# Patient Record
Sex: Female | Born: 1995 | Race: Black or African American | Hispanic: No | Marital: Married | Smoking: Never smoker
Health system: Southern US, Community
[De-identification: ages and names within clinical notes are randomized; demographics above are authoritative.]

## PROBLEM LIST (undated history)

## (undated) DIAGNOSIS — K5090 Crohn's disease, unspecified, without complications: Secondary | ICD-10-CM

## (undated) DIAGNOSIS — J45909 Unspecified asthma, uncomplicated: Secondary | ICD-10-CM

## (undated) HISTORY — PX: TONSILLECTOMY: SUR1361

## (undated) NOTE — *Deleted (*Deleted)
[place]   PATIENT NAME: Megan Haynes    MR#:  [id number]  DATE OF BIRTH:  1995-09-04  DATE OF ADMISSION:  02/09/2020  PRIMARY CARE PHYSICIAN: Patient, No Pcp Per   REQUESTING/REFERRING PHYSICIAN: Alfonse Flavors, MD  CHIEF COMPLAINT:   Chief Complaint  Patient presents with  . Abdominal Pain    HISTORY OF PRESENT ILLNESS:  Megan Haynes  is a 24 y.o. African-American female with a known history of asthma, who presented to the emergency room with acute onset of periumbilical abdominal pain for the last 5 to 6 days which has been worsening.  She describes 5.  Pain is crampy and constant with radiation to her back.  It was associated with nausea and vomiting as well as diarrhea for the last couple of days.  She denied any bilious vomitus or hematemesis or bright red bleeding per rectum or melena.  She had a similar episode last year that was evaluated by GI physician apparently diagnosed possible inflammatory bowel disease.  She has been taking simethicone.  No fever or chills.  No cough or wheezing or dyspnea.  Upon presentation to the emergency room blood pressure was 126/98 and heart rate 118.  Labs revealing cytosis of 14.6.  BMP is currently pending.  UA showed 6-10 WBCs 6-10 RBCs with rare bacteria S percent range 1031 with 80 ketones.  Urine pregnancy test was negative.  Blood culture was drawn.  Abdominal and pelvic CT scan revealed: 1. Marked severity enteritis involving multiple loops of distal and terminal ileum. 2. Additional findings worrisome for the presence of a focus of contained free air within the pelvis, secondary to perforation of the previously noted inflamed small bowel. 3. Findings likely consistent with a small hepatic cyst versus hemangioma.  The patient was given a liter bolus of IV lactated Ringer for 150 mL/h, 0.5 mg of IV Dilaudid and IV Zosyn.  She will be admitted to a medical monitored bed for further evaluation and management.  PAST MEDICAL  HISTORY:   Past Medical History:  Diagnosis Date  . Asthma     PAST SURGICAL HISTORY:  History reviewed. No pertinent surgical history.  SOCIAL HISTORY:   Social History   Tobacco Use  . Smoking status: Never Smoker  . Smokeless tobacco: Never Used  Substance Use Topics  . Alcohol use: Not Currently    FAMILY HISTORY:  No family history on file.  DRUG ALLERGIES:  No Known Allergies  REVIEW OF SYSTEMS:   ROS As per history of present illness. All pertinent systems were reviewed above. Constitutional, HEENT, cardiovascular, respiratory, GI, GU, musculoskeletal, neuro, psychiatric, endocrine, integumentary and hematologic systems were reviewed and are otherwise negative/unremarkable except for positive findings mentioned above in the HPI.   MEDICATIONS AT HOME:   Prior to Admission medications   Medication Sig Start Date End Date Taking? Authorizing Provider  HYDROcodone-acetaminophen (NORCO/VICODIN) 5-325 MG tablet Take 1-2 tablets by mouth every 4 (four) hours as needed for moderate pain. 11/05/15   Loleta Rose, MD      VITAL SIGNS:  Blood pressure (!) 126/98, pulse (!) 118, temperature 98.7 F (37.1 C), temperature source Oral, resp. rate 16, height 5\' 5"  (1.651 m), weight (!) 147.4 kg, last menstrual period 01/03/2020, SpO2 98 %.  PHYSICAL EXAMINATION:  Physical Exam  GENERAL:  89 y.o.-year-old patient lying in the bed with no acute distress.  EYES: Pupils equal, round, reactive to light and accommodation. No scleral icterus. Extraocular muscles intact.  HEENT:  Head atraumatic, normocephalic. Oropharynx and nasopharynx clear.  NECK:  Supple, no jugular venous distention. No thyroid enlargement, no tenderness.  LUNGS: Normal breath sounds bilaterally, no wheezing, rales,rhonchi or crepitation. No use of accessory muscles of respiration.  CARDIOVASCULAR: Regular rate and rhythm, S1, S2 normal. No murmurs, rubs, or gallops.  ABDOMEN: Soft, nondistended, nontender.  Bowel sounds present. No organomegaly or mass.  EXTREMITIES: No pedal edema, cyanosis, or clubbing.  NEUROLOGIC: Cranial nerves II through XII are intact. Muscle strength 5/5 in all extremities. Sensation intact. Gait not checked.  PSYCHIATRIC: The patient is alert and oriented x 3.  Normal affect and good eye contact. SKIN: No obvious rash, lesion, or ulcer.   LABORATORY PANEL:   CBC Recent Labs  Lab 02/09/20 1810  WBC 14.6*  HGB 10.5*  HCT 32.9*  PLT 340   ------------------------------------------------------------------------------------------------------------------  Chemistries  No results for input(s): NA, K, CL, CO2, GLUCOSE, BUN, CREATININE, CALCIUM, MG, AST, ALT, ALKPHOS, BILITOT in the last 168 hours.  Invalid input(s): GFRCGP ------------------------------------------------------------------------------------------------------------------  Cardiac Enzymes No results for input(s): TROPONINI in the last 168 hours. ------------------------------------------------------------------------------------------------------------------  RADIOLOGY:  CT ABDOMEN PELVIS W CONTRAST  Result Date: 02/09/2020 CLINICAL DATA:  Mid upper abdominal pain. EXAM: CT ABDOMEN AND PELVIS WITH CONTRAST TECHNIQUE: Multidetector CT imaging of the abdomen and pelvis was performed using the standard protocol following bolus administration of intravenous contrast. CONTRAST:  OMNIPAQUE IOHEXOL 300 MG/ML  SOLN COMPARISON:  None. FINDINGS: Lower chest: No acute abnormality. Hepatobiliary: A 1.0 cm diameter focus of parenchymal low attenuation is seen within the left lobe of the liver (axial CT image 11, CT series number 2). No gallstones, gallbladder wall thickening, or biliary dilatation. Pancreas: Unremarkable. No pancreatic ductal dilatation or surrounding inflammatory changes. Spleen: Normal in size without focal abnormality. Adrenals/Urinary Tract: Adrenal glands are unremarkable. Kidneys are  normal, without renal calculi, focal lesion, or hydronephrosis. The urinary bladder is contracted and subsequently limited in evaluation. Stomach/Bowel: Stomach is within normal limits. The appendix is retrocecal in location and normal in appearance (axial CT images 57 through 64, CT series number 2/sagittal reformatted images 44 through 59, CT series number 6). There is no evidence of bowel dilatation. Mild to moderate severity thickening of the ascending and proximal transverse colon is seen. Multiple loops of markedly thickened and inflamed distal and terminal ileum are seen within the right lower quadrant and pelvis. Associated peri intestinal inflammatory fat stranding is present. A 1.9 cm x 1.1 cm x 3.1 cm focus of air is seen within the pelvis on the right. This is adjacent to the previously described loops of inflamed distal ileum and cannot definitively be localized within a loop of bowel (axial CT images 68 through 74, CT series number 2). Vascular/Lymphatic: No significant vascular findings are present. No enlarged abdominal or pelvic lymph nodes. Reproductive: Uterus and bilateral adnexa are unremarkable. Other: No abdominal wall hernia or abnormality. No abdominopelvic ascites. Musculoskeletal: No acute or significant osseous findings. IMPRESSION: 1. Marked severity enteritis involving multiple loops of distal and terminal ileum. 2. Additional findings worrisome for the presence of a focus of contained free air within the pelvis, secondary to perforation of the previously noted inflamed small bowel. 3. Findings likely consistent with a small hepatic cyst versus hemangioma. Electronically Signed   By: Aram Candela M.D.   On: 02/09/2020 20:20      IMPRESSION AND PLAN:   1.  Acute severe enteritis with possible small bowel microperforation with subsequent sepsis without severe sepsis or septic  shock.  This is manifested by leukocytosis and tachycardia. -The patient will be admitted to a med  monitored bed. -Pain management will be provided. -Surgery consultation will be obtained. -I notified Dr. Everlene Farrier about the patient. -Continue IV antibiotic therapy with broad-spectrum coverage including IV cefepime, Flagyl and vancomycin given severity of her enteritis pending blood and stool cultures.  2.  Asthma, currently controlled. -She will be placed on as needed duo nebs.  3.  GERD. -She will be placed on IV PPI therapy.  4.  DVT prophylaxis. -Subcutaneous Lovenox    All the records are reviewed and case discussed with ED provider. The plan of care was discussed in details with the patient (and family). I answered all questions. The patient agreed to proceed with the above mentioned plan. Further management will depend upon hospital course.   CODE STATUS: Full code  Status is: Inpatient  Remains inpatient appropriate because:Ongoing active pain requiring inpatient pain management, Ongoing diagnostic testing needed not appropriate for outpatient work up, Unsafe d/c plan, IV treatments appropriate due to intensity of illness or inability to take PO and Inpatient level of care appropriate due to severity of illness   Dispo: The patient is from: Home              Anticipated d/c is to: Home              Anticipated d/c date is: 2 days              Patient currently is not medically stable to d/c.   TOTAL TIME TAKING CARE OF THIS PATIENT: *** minutes.    Hannah Beat M.D on 02/09/2020 at 9:51 PM  [facility]   From 7 PM-7 AM, contact night-coverage [email or web address]  CC: Primary care physician; Patient, No Pcp Per

## (undated) NOTE — *Deleted (*Deleted)
[name]   PATIENT NAME: Megan Haynes    MR#:  [id number]  DATE OF BIRTH:  20-Jun-1995  DATE OF ADMISSION:  02/09/2020  PRIMARY CARE PHYSICIAN: Patient, No Pcp Per   REQUESTING/REFERRING PHYSICIAN: Alfonse Flavors, MD  CHIEF COMPLAINT:   Chief Complaint  Patient presents with  . Abdominal Pain    HISTORY OF PRESENT ILLNESS:  Megan Haynes  is a 64 y.o. African-American female with a known history of asthma, who presented to the emergency room with acute onset of periumbilical and upper abdominal pain for the last 5 to 6 days which has been worsening.  She describes her pain as crampy and constant with radiation to her back.  It was associated with nausea and vomiting on Monday as well as diarrhea for the last couple of days.  She denied any bilious vomitus or hematemesis or bright red bleeding per rectum or melena.  She had a similar episode last year that was evaluated by Dr. Margarita Mail with GI at [facility] who apparently diagnosed possible inflammatory bowel disease.  She has been taking simethicone.  No fever or chills.  No cough or wheezing or dyspnea.  Upon presentation to the emergency room blood pressure was 126/98 and heart rate 118.  Labs revealing cytosis of 14.6.  BMP is currently pending.  UA showed 6-10 WBCs 6-10 RBCs with rare bacteria S percent range 1031 with 80 ketones.  Urine pregnancy test was negative.  Blood culture was drawn.  Abdominal and pelvic CT scan revealed: 1. Marked severity enteritis involving multiple loops of distal and terminal ileum. 2. Additional findings worrisome for the presence of a focus of contained free air within the pelvis, secondary to perforation of the previously noted inflamed small bowel. 3. Findings likely consistent with a small hepatic cyst versus hemangioma.  The patient was given a liter bolus of IV lactated Ringer for 150 mL/h, 0.5 mg of IV Dilaudid and IV Zosyn.  She will be admitted to a medical monitored bed for  further evaluation and management.  PAST MEDICAL HISTORY:   Past Medical History:  Diagnosis Date  . Asthma     PAST SURGICAL HISTORY:  History reviewed. No pertinent surgical history.  SOCIAL HISTORY:   Social History   Tobacco Use  . Smoking status: Never Smoker  . Smokeless tobacco: Never Used  Substance Use Topics  . Alcohol use: Not Currently    FAMILY HISTORY:  No family history on file.  DRUG ALLERGIES:  No Known Allergies  REVIEW OF SYSTEMS:   ROS As per history of present illness. All pertinent systems were reviewed above. Constitutional, HEENT, cardiovascular, respiratory, GI, GU, musculoskeletal, neuro, psychiatric, endocrine, integumentary and hematologic systems were reviewed and are otherwise negative/unremarkable except for positive findings mentioned above in the HPI.   MEDICATIONS AT HOME:   Prior to Admission medications   Medication Sig Start Date End Date Taking? Authorizing Provider  HYDROcodone-acetaminophen (NORCO/VICODIN) 5-325 MG tablet Take 1-2 tablets by mouth every 4 (four) hours as needed for moderate pain. 11/05/15   Loleta Rose, MD      VITAL SIGNS:  Blood pressure (!) 126/98, pulse (!) 118, temperature 98.7 F (37.1 C), temperature source Oral, resp. rate 16, height 5\' 5"  (1.651 m), weight (!) 147.4 kg, last menstrual period 01/03/2020, SpO2 98 %.  PHYSICAL EXAMINATION:  Physical Exam  GENERAL:  40 y.o.-year-old patient lying in the bed with no acute distress.  EYES: Pupils equal, round, reactive to light and  accommodation. No scleral icterus. Extraocular muscles intact.  HEENT: Head atraumatic, normocephalic. Oropharynx and nasopharynx clear.  NECK:  Supple, no jugular venous distention. No thyroid enlargement, no tenderness.  LUNGS: Normal breath sounds bilaterally, no wheezing, rales,rhonchi or crepitation. No use of accessory muscles of respiration.  CARDIOVASCULAR: Regular rate and rhythm, S1, S2 normal. No murmurs, rubs, or  gallops.  ABDOMEN: Soft, nondistended, nontender. Bowel sounds present. No organomegaly or mass.  EXTREMITIES: No pedal edema, cyanosis, or clubbing.  NEUROLOGIC: Cranial nerves II through XII are intact. Muscle strength 5/5 in all extremities. Sensation intact. Gait not checked.  PSYCHIATRIC: The patient is alert and oriented x 3.  Normal affect and good eye contact. SKIN: No obvious rash, lesion, or ulcer.   LABORATORY PANEL:   CBC Recent Labs  Lab 02/09/20 1810  WBC 14.6*  HGB 10.5*  HCT 32.9*  PLT 340   ------------------------------------------------------------------------------------------------------------------  Chemistries  Recent Labs  Lab 02/09/20 2119  NA 135  K 3.1*  CL 98  CO2 26  GLUCOSE 85  BUN 10  CREATININE 0.72  CALCIUM 8.8*  AST 12*  ALT 11  ALKPHOS 50  BILITOT 0.7   ------------------------------------------------------------------------------------------------------------------  Cardiac Enzymes No results for input(s): TROPONINI in the last 168 hours. ------------------------------------------------------------------------------------------------------------------  RADIOLOGY:  CT ABDOMEN PELVIS W CONTRAST  Result Date: 02/09/2020 CLINICAL DATA:  Mid upper abdominal pain. EXAM: CT ABDOMEN AND PELVIS WITH CONTRAST TECHNIQUE: Multidetector CT imaging of the abdomen and pelvis was performed using the standard protocol following bolus administration of intravenous contrast. CONTRAST:  OMNIPAQUE IOHEXOL 300 MG/ML  SOLN COMPARISON:  None. FINDINGS: Lower chest: No acute abnormality. Hepatobiliary: A 1.0 cm diameter focus of parenchymal low attenuation is seen within the left lobe of the liver (axial CT image 11, CT series number 2). No gallstones, gallbladder wall thickening, or biliary dilatation. Pancreas: Unremarkable. No pancreatic ductal dilatation or surrounding inflammatory changes. Spleen: Normal in size without focal abnormality.  Adrenals/Urinary Tract: Adrenal glands are unremarkable. Kidneys are normal, without renal calculi, focal lesion, or hydronephrosis. The urinary bladder is contracted and subsequently limited in evaluation. Stomach/Bowel: Stomach is within normal limits. The appendix is retrocecal in location and normal in appearance (axial CT images 57 through 64, CT series number 2/sagittal reformatted images 44 through 59, CT series number 6). There is no evidence of bowel dilatation. Mild to moderate severity thickening of the ascending and proximal transverse colon is seen. Multiple loops of markedly thickened and inflamed distal and terminal ileum are seen within the right lower quadrant and pelvis. Associated peri intestinal inflammatory fat stranding is present. A 1.9 cm x 1.1 cm x 3.1 cm focus of air is seen within the pelvis on the right. This is adjacent to the previously described loops of inflamed distal ileum and cannot definitively be localized within a loop of bowel (axial CT images 68 through 74, CT series number 2). Vascular/Lymphatic: No significant vascular findings are present. No enlarged abdominal or pelvic lymph nodes. Reproductive: Uterus and bilateral adnexa are unremarkable. Other: No abdominal wall hernia or abnormality. No abdominopelvic ascites. Musculoskeletal: No acute or significant osseous findings. IMPRESSION: 1. Marked severity enteritis involving multiple loops of distal and terminal ileum. 2. Additional findings worrisome for the presence of a focus of contained free air within the pelvis, secondary to perforation of the previously noted inflamed small bowel. 3. Findings likely consistent with a small hepatic cyst versus hemangioma. Electronically Signed   By: Aram Candela M.D.   On: 02/09/2020 20:20  IMPRESSION AND PLAN:   1.  Acute severe enteritis with possible small bowel microperforation with subsequent sepsis without severe sepsis or septic shock.  This is manifested by  leukocytosis and tachycardia. -The patient will be admitted to a med monitored bed. -Pain management will be provided. -Surgery consultation will be obtained. -I notified Dr. Everlene Farrier about the patient. -Continue IV antibiotic therapy with broad-spectrum coverage including IV cefepime, Flagyl and vancomycin given severity of her enteritis pending blood and stool cultures.  2.  Asthma, currently controlled. -She will be placed on as needed duo nebs.  3.  GERD. -She will be placed on IV PPI therapy.  4.  DVT prophylaxis. -Subcutaneous Lovenox    All the records are reviewed and case discussed with ED provider. The plan of care was discussed in details with the patient (and family). I answered all questions. The patient agreed to proceed with the above mentioned plan. Further management will depend upon hospital course.   CODE STATUS: Full code  Status is: Inpatient  Remains inpatient appropriate because:Ongoing active pain requiring inpatient pain management, Ongoing diagnostic testing needed not appropriate for outpatient work up, Unsafe d/c plan, IV treatments appropriate due to intensity of illness or inability to take PO and Inpatient level of care appropriate due to severity of illness   Dispo: The patient is from: Home              Anticipated d/c is to: Home              Anticipated d/c date is: 2 days              Patient currently is not medically stable to d/c.   TOTAL TIME TAKING CARE OF THIS PATIENT: *** minutes.    Hannah Beat M.D on 02/09/2020 at 10:55 PM  [facility]   From 7 PM-7 AM, contact night-coverage [email or web address]  CC: Primary care physician; Patient, No Pcp Per

---
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
## 2015-11-05 ENCOUNTER — Encounter: Payer: Self-pay | Admitting: Emergency Medicine

## 2015-11-05 ENCOUNTER — Emergency Department
Admission: EM | Admit: 2015-11-05 | Discharge: 2015-11-05 | Disposition: A | Discharge status: Home / Self Care | Payer: Self-pay | Attending: Emergency Medicine | Admitting: Emergency Medicine

## 2015-11-05 DIAGNOSIS — K029 Dental caries, unspecified: Secondary | ICD-10-CM | POA: Insufficient documentation

## 2015-11-05 DIAGNOSIS — Y929 Unspecified place or not applicable: Secondary | ICD-10-CM | POA: Insufficient documentation

## 2015-11-05 DIAGNOSIS — S025XXA Fracture of tooth (traumatic), initial encounter for closed fracture: Secondary | ICD-10-CM | POA: Insufficient documentation

## 2015-11-05 DIAGNOSIS — Y939 Activity, unspecified: Secondary | ICD-10-CM | POA: Insufficient documentation

## 2015-11-05 DIAGNOSIS — Y999 Unspecified external cause status: Secondary | ICD-10-CM | POA: Insufficient documentation

## 2015-11-05 DIAGNOSIS — X58XXXA Exposure to other specified factors, initial encounter: Secondary | ICD-10-CM | POA: Insufficient documentation

## 2015-11-05 MED ORDER — OXYCODONE-ACETAMINOPHEN 5-325 MG PO TABS
ORAL_TABLET | ORAL | Status: AC
  Filled 2015-11-05: qty 1

## 2015-11-05 MED ORDER — OXYCODONE-ACETAMINOPHEN 5-325 MG PO TABS
1.0000 | ORAL_TABLET | ORAL | Status: DC | PRN
  Administered 2015-11-05: 1 via ORAL
  Filled 2015-11-05: qty 1

## 2015-11-05 MED ORDER — KETOROLAC TROMETHAMINE 60 MG/2ML IM SOLN
30.0000 mg | Freq: Once | INTRAMUSCULAR | Status: DC
  Filled 2015-11-05: qty 2

## 2015-11-05 MED ORDER — OXYCODONE-ACETAMINOPHEN 5-325 MG PO TABS
1.0000 | ORAL_TABLET | Freq: Once | ORAL | Status: AC
Start: 2015-11-05 — End: 2015-11-05
  Administered 2015-11-05: 1 via ORAL

## 2015-11-05 MED ORDER — HYDROCODONE-ACETAMINOPHEN 5-325 MG PO TABS: 1.0000 | ORAL_TABLET | ORAL | Status: DC | PRN

## 2015-11-05 NOTE — ED Provider Notes (Signed)
[facility] Emergency Department Provider Note  ____________________________________________  Time seen: Approximately 2:58 AM  I have reviewed the triage vital signs and the nursing notes.   HISTORY  Chief Complaint Dental Pain    HPI Megan Haynes is a 20 y.o. female with no significant past medical history who presents for evaluation of dental pain.  She reports that she has been grinding her teeth at night and that she has had gradual onset of worsening dental pain for the last 2 days.  Heat and cold make it much worse and nothing particular makes it better.  She has been taking over-the-counter Tylenol and ibuprofen and initially worked but as the pain gets worse the medications are not helping.  She reports that when she chews food or gum she can feel small fragments of the tooth breaking off.  She plans to see a dentist today (Saturday) if possible but she is concerned that he may be closed and she does not know she can make it through the weekend with the pain.  She denies fever/chills, chest pain, shortness of breath, nausea, vomiting, diarrhea, dysuria.  She states that she is sexually active but uses condoms always and that she feels there is no chance that she could be pregnant.She describes the tooth in question as being the one in the back upper left.  She has had no specific trauma.  She has no swelling of her gums, face, or neck.  She is having no difficulty swallowing   History reviewed. No pertinent past medical history.  There are no active problems to display for this patient.   History reviewed. No pertinent past surgical history.  Current Outpatient Rx  Name  Route  Sig  Dispense  Refill  . HYDROcodone-acetaminophen (NORCO/VICODIN) 5-325 MG tablet   Oral   Take 1-2 tablets by mouth every 4 (four) hours as needed for moderate pain.   15 tablet   0     Allergies Review of patient's allergies indicates no known allergies.  No  family history on file.  Social History Social History  Substance Use Topics  . Smoking status: Never Smoker   . Smokeless tobacco: None  . Alcohol Use: None    Review of Systems Constitutional: No fever/chills ENT: No sore throat.  Dental pain on back upper left Cardiovascular: Denies chest pain. Respiratory: Denies shortness of breath. Gastrointestinal: No abdominal pain.  No nausea, no vomiting.  No diarrhea.  No constipation. Genitourinary: Negative for dysuria. Musculoskeletal: Negative for back pain. Skin: Negative for rash. Neurological: Negative for headaches, focal weakness or numbness.  10-point ROS otherwise negative.  ____________________________________________   PHYSICAL EXAM:  VITAL SIGNS: ED Triage Vitals  Enc Vitals Group     BP 11/05/15 0116 126/82 mmHg     Pulse Rate 11/05/15 0116 74     Resp 11/05/15 0116 18     Temp 11/05/15 0116 97.9 F (36.6 C)     Temp Source 11/05/15 0116 Oral     SpO2 11/05/15 0116 100 %     Weight 11/05/15 0116 260 lb (117.935 kg)     Height 11/05/15 0116  (1.651 m)     Head Cir --      Peak Flow --      Pain Score 11/05/15 0116 7     Pain Loc --      Pain Edu? --      Excl. in GC? --     Constitutional: Alert and  oriented. Well appearing But appears uncomfortable Eyes: Conjunctivae are normal. PERRL. EOMI. Head: Atraumatic. Nose: No congestion/rhinnorhea. Mouth/Throat: Mucous membranes are moist.  Oropharynx non-erythematous.  Numerous dental caries.  The back upper left molar (I believe it is #16) is fractured and falling apart.  There is chronic caries and it is difficult to appreciate what is acute but there is no evidence of acute infection or abscess.  There is no evidence of peritonsillar abscess or Ludwig's angina with no brawny induration Neck: No stridor.  No meningeal signs.  No cervical lymphadenopathy Cardiovascular: Normal rate, regular rhythm. Good peripheral circulation.  Respiratory: Normal  respiratory effort.  No retractions. Lungs CTAB. Skin:  Skin is warm, dry and intact. No rash noted. Psychiatric: Mood and affect are normal. Speech and behavior are normal.  ____________________________________________   LABS (all labs ordered are listed, but only abnormal results are displayed)  Labs Reviewed - No data to display ____________________________________________  EKG  None ____________________________________________  RADIOLOGY   No results found.  ____________________________________________   PROCEDURES  Procedure(s) performed:   Procedures   ____________________________________________   INITIAL IMPRESSION / ASSESSMENT AND PLAN / ED COURSE  Pertinent labs & imaging results that were available during my care of the patient were reviewed by me and considered in my medical decision making (see chart for details).  I reviewed the patient's prescription history over the last 12 months in the [place] Controlled Substances Database, and he has had no prescriptions filled within this time.  Given how painful the fractured tooth appears and the fact that we are on the weekend, I will give her a shot of Toradol and 2 Percocet in the emergency department.  I am giving her prescription for a few Norco so that she can follow-up with dentist.  No indication for antibiotics at this time.  I gave my usual and customary return precautions.       ____________________________________________  FINAL CLINICAL IMPRESSION(S) / ED DIAGNOSES  Final diagnoses:  Broken tooth, closed, initial encounter  Dental caries     MEDICATIONS GIVEN DURING THIS VISIT:  Medications  oxyCODONE-acetaminophen (PERCOCET/ROXICET) 5-325 MG per tablet 1 tablet (1 tablet Oral Given 11/05/15 0205)  ketorolac (TORADOL) injection 30 mg (30 mg Intramuscular Refused 11/05/15 0310)  oxyCODONE-acetaminophen (PERCOCET/ROXICET) 5-325 MG per tablet 1 tablet (1 tablet Oral Given 11/05/15 0311)     NEW  OUTPATIENT MEDICATIONS STARTED DURING THIS VISIT:  New Prescriptions   HYDROCODONE-ACETAMINOPHEN (NORCO/VICODIN) 5-325 MG TABLET    Take 1-2 tablets by mouth every 4 (four) hours as needed for moderate pain.      Note:  This document was prepared using Dragon voice recognition software and may include unintentional dictation errors.   Loleta Rose, MD 11/05/15 [phone]

## 2015-11-05 NOTE — Discharge Instructions (Signed)
You have been seen in the Emergency Department (ED) today for dental pain.    Take Norco as prescribed for severe pain. Do not drink alcohol, drive or participate in any other potentially dangerous activities while taking this medication as it may make you sleepy. Do not take this medication with any other sedating medications, either prescription or over-the-counter. If you were prescribed Percocet or Vicodin, do not take these with acetaminophen (Tylenol) as it is already contained within these medications.   This medication is an opiate (or narcotic) pain medication and can be habit forming.  Use it as little as possible to achieve adequate pain control.  Do not use or use it with extreme caution if you have a history of opiate abuse or dependence.  If you are on a pain contract with your primary care doctor or a pain specialist, be sure to let them know you were prescribed this medication today from the [facility] Emergency Department.  This medication is intended for your use only - do not give any to anyone else and keep it in a secure place where nobody else, especially children, have access to it.  It will also cause or worsen constipation, so you may want to consider taking an over-the-counter stool softener while you are taking this medication.  You should also take over-the-counter pain medication such as ibuprofen according to the label instructions unless a doctor has previously told you to avoid this type of medication (due to stomach ulcers, for example).  Alternatively you can take ibuprofen 600 mg by mouth three times daily with meals for no more than 5 days.  Please see you dentist as soon as possible; only a dentist will be able to fix your problem(s).  Please see below for dental follow up options.  Return to the ED if you develop worsening pain, fever, pus/drainage, difficulty breathing, or other symptoms that concern you.  Dental Pain   Dental pain may be caused by many  things, including:  Tooth decay (cavities or caries). Cavities expose the nerve of your tooth to air and hot or cold temperatures. This can cause pain or discomfort.  Abscess or infection. A dental abscess is a collection of infected pus from a bacterial infection in the inner part of the tooth (pulp). It usually occurs at the end of the tooth's root.  Injury.  An unknown reason (idiopathic). Your pain may be mild or severe. It may only occur when:  You are chewing.  You are exposed to hot or cold temperature.  You are eating or drinking sugary foods or beverages, such as soda or candy. Your pain may also be constant.  HOME CARE INSTRUCTIONS  Watch your dental pain for any changes. The following actions may help to lessen any discomfort that you are feeling:  Take medicines only as directed by your dentist.  If you were prescribed an antibiotic medicine, finish all of it even if you start to feel better.  Keep all follow-up visits as directed by your dentist. This is important.  Do not apply heat to the outside of your face.  Rinse your mouth or gargle with salt water if directed by your dentist. This helps with pain and swelling.  You can make salt water by adding  tsp of salt to 1 cup of warm water. Apply ice to the painful area of your face:  Put ice in a plastic bag.  Place a towel between your skin and the bag.  Leave the  ice on for 20 minutes, 2-3 times per day. Avoid foods or drinks that cause you pain, such as:  Very hot or very cold foods or drinks.  Sweet or sugary foods or drinks. SEEK MEDICAL CARE IF:  Your pain is not controlled with medicines.  Your symptoms are worse.  You have new symptoms. SEEK IMMEDIATE MEDICAL CARE IF:  You are unable to open your mouth.  You are having trouble breathing or swallowing.  You have a fever.  Your face, neck, or jaw is swollen. This information is not intended to replace advice given to you by your health care provider. Make sure you  discuss any questions you have with your health care provider.  Document Released: 04/09/2005 Document Revised: 08/24/2014 Document Reviewed: 04/05/2014  Elsevier Interactive Patient Education 2016 [employer].    OPTIONS FOR DENTAL FOLLOW UP CARE  [place] Department of Health and Human Services - Local Safety Net Dental Clinics [email or web address]   [facility] [phone])  [facility] [phone])  [place] [phone] ext [phone])  [facility] [phone])  [facility] [phone]) This clinic caters to the indigent population and is on a lottery system. Location: [employer] of Dentistry, [employer], [address], [place] Clinic Hours: Wednesdays from 6pm - 9pm, patients seen by a lottery system. For dates, call or go to [email or web address] Services: Cleanings, fillings and simple extractions. Payment Options: DENTAL WORK IS FREE OF CHARGE. Bring proof of income or support. Best way to get seen: Arrive at 5:15 pm - this is a lottery, NOT first come/first serve, so arriving earlier will not increase your chances of being seen.     [facility] [phone] Select option 1 for emergencies   Location: [facility]istry, [place], [address] [place] Clinic Hours: No walk-ins accepted - call the day before to schedule an appointment. Check in times are 9:30 am and 1:30 pm. Services: Simple extractions, temporary fillings, pulpectomy/pulp debridement, uncomplicated abscess drainage. Payment Options: PAYMENT IS DUE AT THE TIME OF SERVICE.  Fee is usually $100-200, additional surgical procedures (e.g. abscess drainage) may be extra. Cash, checks, Visa/MasterCard accepted.  Can file Medicaid if patient is covered for dental - patient should call case worker to check. No discount for [facility] patients. Best way to get seen: MUST call the day before and get onto the schedule. Can usually be seen the next 1-2 days. No walk-ins accepted.     [facility] [phone]   Location: [facility], [address] [place] Clinic Hours: M, W, Th, F 8am or 1:30pm, Tues 9a or 1:30 - first come/first served. Services: Simple extractions, temporary fillings, uncomplicated abscess drainage.  You do not need to be an [facility] resident. Payment Options: PAYMENT IS DUE AT THE TIME OF SERVICE. Dental insurance, otherwise sliding scale - bring proof of income or support. Depending on income and treatment needed, cost is usually $50-200. Best way to get seen: Arrive early as it is first come/first served.     [facility] [phone]   Location: [address] Clinic Hours: Mon-Thu 8a-5p Services: Most basic dental services including extractions and fillings. Payment Options: PAYMENT IS DUE AT THE TIME OF SERVICE. Sliding scale, up to 50% off - bring proof if income or support. Medicaid with dental option accepted. Best way to get seen: Call to schedule an appointment, can usually be  seen within 2 weeks OR they will try to see walk-ins - show up at 8a or 2p (you may have to wait).     [facility] [phone] [place] RESIDENTS ONLY   Location: [facility], [address], [place], [place] [address] Clinic Hours: By appointment only. Monday - Thursday 8am-5pm, Friday 8am-12pm Services: Cleanings, fillings, extractions. Payment Options: PAYMENT IS DUE AT THE TIME OF SERVICE. Cash, Visa or MasterCard. Sliding scale - $30 minimum per service. Best way to get seen: Come in to office, complete packet and make an appointment - need proof of income or support monies for each household member and proof of [facility] residence. Usually takes  about a month to get in.     [facility] [phone]   Location: [address] [facility] Clinic Hours: Walk-in Urgent Care Dental Services are offered Monday-Friday mornings only. The numbers of emergencies accepted daily is limited to the number of providers available. Maximum 15 - Mondays, Wednesdays & Thursdays Maximum 10 - Tuesdays & Fridays Services: You do not need to be a [facility] resident to be seen for a dental emergency. Emergencies are defined as pain, swelling, abnormal bleeding, or dental trauma. Walkins will receive x-rays if needed. NOTE: Dental cleaning is not an emergency. Payment Options: PAYMENT IS DUE AT THE TIME OF SERVICE. Minimum co-pay is $40.00 for uninsured patients. Minimum co-pay is $3.00 for Medicaid with dental coverage. Dental Insurance is accepted and must be presented at time of visit. Medicare does not cover dental. Forms of payment: Cash, credit card, checks. Best way to get seen: If not previously registered with the clinic, walk-in dental registration begins at 7:15 am and is on a first come/first serve basis. If previously registered with the clinic, call to make an appointment.     [facility] [phone] [place] RESIDENTS ONLY   Location: [address], [place], [place] Clinic Hours: Mon-Thu 10a-2p Services: Extractions only! Payment Options: FREE (donations accepted) - bring proof of income or support Best way to get seen: Call and schedule an appointment OR come at 8am on the 1st Monday of every month (except for holidays) when it is first come/first served.     [facility] [phone]   Location: [address] [place], [place] Clinic Hours: Friday mornings Services, Payment Options, Best way to get seen: Call for info

## 2015-11-05 NOTE — ED Notes (Signed)
Patient states that she grinds her teeth at night and has caused pain to left upper tooth. Patient reports pain times 2 days with no relief from OTC medications.

## 2019-05-14 ENCOUNTER — Other Ambulatory Visit: Payer: Self-pay

## 2019-08-14 DIAGNOSIS — L209 Atopic dermatitis, unspecified: Secondary | ICD-10-CM | POA: Insufficient documentation

## 2019-12-29 DIAGNOSIS — D509 Iron deficiency anemia, unspecified: Secondary | ICD-10-CM | POA: Insufficient documentation

## 2020-02-09 ENCOUNTER — Encounter: Payer: Self-pay | Admitting: Emergency Medicine

## 2020-02-09 ENCOUNTER — Inpatient Hospital Stay
Admission: EM | Admit: 2020-02-09 | Discharge: 2020-02-11 | DRG: 871 | Disposition: A | Discharge status: Home / Self Care | Payer: BC Managed Care – PPO | Attending: Internal Medicine | Admitting: Internal Medicine

## 2020-02-09 ENCOUNTER — Emergency Department: Payer: BC Managed Care – PPO

## 2020-02-09 ENCOUNTER — Other Ambulatory Visit: Payer: Self-pay

## 2020-02-09 DIAGNOSIS — R1033 Periumbilical pain: Secondary | ICD-10-CM | POA: Diagnosis present

## 2020-02-09 DIAGNOSIS — K5000 Crohn's disease of small intestine without complications: Secondary | ICD-10-CM | POA: Diagnosis present

## 2020-02-09 DIAGNOSIS — E8809 Other disorders of plasma-protein metabolism, not elsewhere classified: Secondary | ICD-10-CM | POA: Diagnosis not present

## 2020-02-09 DIAGNOSIS — D509 Iron deficiency anemia, unspecified: Secondary | ICD-10-CM | POA: Diagnosis present

## 2020-02-09 DIAGNOSIS — A419 Sepsis, unspecified organism: Secondary | ICD-10-CM | POA: Diagnosis present

## 2020-02-09 DIAGNOSIS — D72829 Elevated white blood cell count, unspecified: Secondary | ICD-10-CM

## 2020-02-09 DIAGNOSIS — K529 Noninfective gastroenteritis and colitis, unspecified: Secondary | ICD-10-CM | POA: Diagnosis not present

## 2020-02-09 DIAGNOSIS — K219 Gastro-esophageal reflux disease without esophagitis: Secondary | ICD-10-CM | POA: Diagnosis present

## 2020-02-09 DIAGNOSIS — Z833 Family history of diabetes mellitus: Secondary | ICD-10-CM

## 2020-02-09 DIAGNOSIS — K631 Perforation of intestine (nontraumatic): Secondary | ICD-10-CM | POA: Diagnosis present

## 2020-02-09 DIAGNOSIS — Z8249 Family history of ischemic heart disease and other diseases of the circulatory system: Secondary | ICD-10-CM

## 2020-02-09 DIAGNOSIS — Z20822 Contact with and (suspected) exposure to covid-19: Secondary | ICD-10-CM | POA: Diagnosis present

## 2020-02-09 DIAGNOSIS — E876 Hypokalemia: Secondary | ICD-10-CM | POA: Diagnosis not present

## 2020-02-09 DIAGNOSIS — Z6841 Body Mass Index (BMI) 40.0 and over, adult: Secondary | ICD-10-CM | POA: Diagnosis not present

## 2020-02-09 HISTORY — DX: Unspecified asthma, uncomplicated: J45.909

## 2020-02-09 LAB — URINALYSIS, COMPLETE (UACMP) WITH MICROSCOPIC
Bilirubin Urine: NEGATIVE
Glucose, UA: NEGATIVE mg/dL
Hgb urine dipstick: NEGATIVE
Ketones, ur: 80 mg/dL — AB
Leukocytes,Ua: NEGATIVE
Nitrite: NEGATIVE
Protein, ur: 100 mg/dL — AB
Specific Gravity, Urine: 1.031 — ABNORMAL HIGH (ref 1.005–1.030)
pH: 5 (ref 5.0–8.0)

## 2020-02-09 LAB — CBC
HCT: 32.9 % — ABNORMAL LOW (ref 36.0–46.0)
Hemoglobin: 10.5 g/dL — ABNORMAL LOW (ref 12.0–15.0)
MCH: 22.8 pg — ABNORMAL LOW (ref 26.0–34.0)
MCHC: 31.9 g/dL (ref 30.0–36.0)
MCV: 71.5 fL — ABNORMAL LOW (ref 80.0–100.0)
Platelets: 340 10*3/uL (ref 150–400)
RBC: 4.6 MIL/uL (ref 3.87–5.11)
RDW: 15.5 % (ref 11.5–15.5)
WBC: 14.6 10*3/uL — ABNORMAL HIGH (ref 4.0–10.5)
nRBC: 0 % (ref 0.0–0.2)

## 2020-02-09 LAB — COMPREHENSIVE METABOLIC PANEL
ALT: 11 U/L (ref 0–44)
AST: 12 U/L — ABNORMAL LOW (ref 15–41)
Albumin: 3.2 g/dL — ABNORMAL LOW (ref 3.5–5.0)
Alkaline Phosphatase: 50 U/L (ref 38–126)
Anion gap: 11 (ref 5–15)
BUN: 10 mg/dL (ref 6–20)
CO2: 26 mmol/L (ref 22–32)
Calcium: 8.8 mg/dL — ABNORMAL LOW (ref 8.9–10.3)
Chloride: 98 mmol/L (ref 98–111)
Creatinine, Ser: 0.72 mg/dL (ref 0.44–1.00)
GFR, Estimated: >60 mL/min (ref >60)
Glucose, Bld: 85 mg/dL (ref 70–99)
Potassium: 3.1 mmol/L — ABNORMAL LOW (ref 3.5–5.1)
Sodium: 135 mmol/L (ref 135–145)
Total Bilirubin: 0.7 mg/dL (ref 0.3–1.2)
Total Protein: 7.5 g/dL (ref 6.5–8.1)

## 2020-02-09 LAB — RESPIRATORY PANEL BY RT PCR (FLU A&B, COVID)
Influenza A by PCR: NEGATIVE
Influenza B by PCR: NEGATIVE
SARS Coronavirus 2 by RT PCR: NEGATIVE

## 2020-02-09 LAB — LACTIC ACID, PLASMA: Lactic Acid, Venous: 0.9 mmol/L (ref 0.5–1.9)

## 2020-02-09 LAB — LIPASE, BLOOD: Lipase: 23 U/L (ref 11–51)

## 2020-02-09 LAB — PREGNANCY, URINE: Preg Test, Ur: NEGATIVE

## 2020-02-09 LAB — POC URINE PREG, ED: Preg Test, Ur: NEGATIVE

## 2020-02-09 MED ORDER — PIPERACILLIN-TAZOBACTAM 3.375 G IVPB 30 MIN
3.3750 g | Freq: Once | INTRAVENOUS | Status: AC
  Administered 2020-02-09: 3.375 g via INTRAVENOUS
  Filled 2020-02-09: qty 50

## 2020-02-09 MED ORDER — LACTATED RINGERS IV SOLN: INTRAVENOUS | Status: DC

## 2020-02-09 MED ORDER — SODIUM CHLORIDE 0.9 % IV SOLN: INTRAVENOUS | Status: DC

## 2020-02-09 MED ORDER — SODIUM CHLORIDE 0.9% FLUSH: 3.0000 mL | INTRAVENOUS | Status: DC | PRN

## 2020-02-09 MED ORDER — ACETAMINOPHEN 650 MG RE SUPP: 650.0000 mg | Freq: Four times a day (QID) | RECTAL | Status: DC | PRN

## 2020-02-09 MED ORDER — HYDROMORPHONE HCL 1 MG/ML IJ SOLN: 0.5000 mg | Freq: Once | INTRAMUSCULAR | Status: DC

## 2020-02-09 MED ORDER — SODIUM CHLORIDE 0.9 % IV SOLN
2.0000 g | Freq: Three times a day (TID) | INTRAVENOUS | Status: DC
  Administered 2020-02-10 – 2020-02-11 (×4): 2 g via INTRAVENOUS
  Filled 2020-02-09 (×6): qty 2

## 2020-02-09 MED ORDER — SODIUM CHLORIDE 0.9 % IV SOLN: 250.0000 mL | INTRAVENOUS | Status: DC | PRN

## 2020-02-09 MED ORDER — TRAZODONE HCL 50 MG PO TABS: 25.0000 mg | ORAL_TABLET | Freq: Every evening | ORAL | Status: DC | PRN

## 2020-02-09 MED ORDER — SODIUM CHLORIDE 0.9% FLUSH
3.0000 mL | Freq: Two times a day (BID) | INTRAVENOUS | Status: DC
  Administered 2020-02-10: 3 mL via INTRAVENOUS

## 2020-02-09 MED ORDER — ACETAMINOPHEN 325 MG PO TABS: 650.0000 mg | ORAL_TABLET | Freq: Four times a day (QID) | ORAL | Status: DC | PRN

## 2020-02-09 MED ORDER — METRONIDAZOLE IN NACL 5-0.79 MG/ML-% IV SOLN
500.0000 mg | Freq: Three times a day (TID) | INTRAVENOUS | Status: DC
  Administered 2020-02-10 – 2020-02-11 (×4): 500 mg via INTRAVENOUS
  Filled 2020-02-09 (×6): qty 100

## 2020-02-09 MED ORDER — IOHEXOL 300 MG/ML  SOLN
125.0000 mL | Freq: Once | INTRAMUSCULAR | Status: AC | PRN
  Administered 2020-02-09: 125 mL via INTRAVENOUS
  Filled 2020-02-09: qty 125

## 2020-02-09 MED ORDER — ENOXAPARIN SODIUM 80 MG/0.8ML ~~LOC~~ SOLN
0.5000 mg/kg | SUBCUTANEOUS | Status: DC
  Administered 2020-02-11: 72.5 mg via SUBCUTANEOUS
  Filled 2020-02-09 (×3): qty 0.8

## 2020-02-09 MED ORDER — ONDANSETRON HCL 4 MG PO TABS: 4.0000 mg | ORAL_TABLET | Freq: Four times a day (QID) | ORAL | Status: DC | PRN

## 2020-02-09 MED ORDER — LACTATED RINGERS IV BOLUS
1000.0000 mL | Freq: Once | INTRAVENOUS | Status: AC
Start: 2020-02-09 — End: 2020-02-10
  Administered 2020-02-09: 1000 mL via INTRAVENOUS

## 2020-02-09 MED ORDER — MORPHINE SULFATE (PF) 2 MG/ML IV SOLN: 2.0000 mg | INTRAVENOUS | Status: DC | PRN

## 2020-02-09 MED ORDER — ONDANSETRON HCL 4 MG/2ML IJ SOLN: 4.0000 mg | Freq: Four times a day (QID) | INTRAMUSCULAR | Status: DC | PRN

## 2020-02-09 MED ORDER — LACTATED RINGERS IV BOLUS
1000.0000 mL | Freq: Once | INTRAVENOUS | Status: AC
  Administered 2020-02-09: 1000 mL via INTRAVENOUS

## 2020-02-09 NOTE — H&P (Addendum)
[place]   PATIENT NAME: Megan Haynes    MR#:  [id number]  DATE OF BIRTH:  23-Mar-1996  DATE OF ADMISSION:  02/09/2020  PRIMARY CARE PHYSICIAN: Patient, No Pcp Per   REQUESTING/REFERRING PHYSICIAN: Alfonse Flavors, MD  CHIEF COMPLAINT:   Chief Complaint  Patient presents with  . Abdominal Pain    HISTORY OF PRESENT ILLNESS:  Megan Haynes  is a 24 y.o. African-American female with a known history of asthma, who presented to the emergency room with acute onset of periumbilical and upper abdominal pain for the last 5 to 6 days which has been worsening.  She describes her pain as crampy and constant with radiation to her back.  It was associated with nausea and vomiting on Monday as well as diarrhea for the last couple of days.  She denied any bilious vomitus or hematemesis or bright red bleeding per rectum or melena.  She had a similar episode last year that was evaluated by Wylie Hail, PA with GI at [facility] clinic who diagnosed her with possible irritable bowel disease.  She has been taking simethicone and Levsin.  No fever but she had chills on Friday. No cough or wheezing or dyspnea.  She has been vaccinated for COVID-19.  No dysuria, oliguria or hematuria urgency or frequency or flank pain.  No bleeding diathesis.  Upon presentation to the emergency room blood pressure was 126/98 and heart rate 118.  Labs revealing cytosis of 14.6.  BMP is currently pending.  UA showed 6-10 WBCs 6-10 RBCs with rare bacteria S percent range 1031 with 80 ketones.  Urine pregnancy test was negative.  Blood culture was drawn.  Abdominal and pelvic CT scan revealed: 1. Marked severity enteritis involving multiple loops of distal and terminal ileum. 2. Additional findings worrisome for the presence of a focus of contained free air within the pelvis, secondary to perforation of the previously noted inflamed small bowel. 3. Findings likely consistent with a small hepatic cyst  versus hemangioma.  The patient was given a liter bolus of IV lactated Ringer for 150 mL/h, 0.5 mg of IV Dilaudid and IV Zosyn.  Dr. Everlene Farrier was notified about the patient, reviewed her CT scan and is aware.  She will be admitted to a medical monitored bed for further evaluation and management.  PAST MEDICAL HISTORY:   Past Medical History:  Diagnosis Date  . Asthma     PAST SURGICAL HISTORY:  History reviewed. No pertinent surgical history.  She denies any previous surgeries.  SOCIAL HISTORY:   Social History   Tobacco Use  . Smoking status: Never Smoker  . Smokeless tobacco: Never Used  Substance Use Topics  . Alcohol use: Not Currently    FAMILY HISTORY:   Positive for diabetes mellitus and hypertension.  DRUG ALLERGIES:  No Known Allergies  REVIEW OF SYSTEMS:   ROS As per history of present illness. All pertinent systems were reviewed above. Constitutional, HEENT, cardiovascular, respiratory, GI, GU, musculoskeletal, neuro, psychiatric, endocrine, integumentary and hematologic systems were reviewed and are otherwise negative/unremarkable except for positive findings mentioned above in the HPI.   MEDICATIONS AT HOME:   Prior to Admission medications   Medication Sig Start Date End Date Taking? Authorizing Provider  HYDROcodone-acetaminophen (NORCO/VICODIN) 5-325 MG tablet Take 1-2 tablets by mouth every 4 (four) hours as needed for moderate pain. 11/05/15   Loleta Rose, MD      VITAL SIGNS:  Blood pressure (!) 126/98, pulse (!) 118, temperature 98.7 F (  37.1 C), temperature source Oral, resp. rate 16, height 5\' 5"  (1.651 m), weight (!) 147.4 kg, last menstrual period 01/03/2020, SpO2 98 %.  PHYSICAL EXAMINATION:  Physical Exam  GENERAL:  24 y.o.-year-old African-American female patient lying in the bed with no acute distress.  EYES: Pupils equal, round, reactive to light and accommodation. No scleral icterus. Extraocular muscles intact.  HEENT: Head  atraumatic, normocephalic. Oropharynx and nasopharynx clear.  NECK:  Supple, no jugular venous distention. No thyroid enlargement, no tenderness.  LUNGS: Normal breath sounds bilaterally, no wheezing, rales,rhonchi or crepitation. No use of accessory muscles of respiration.  CARDIOVASCULAR: Regular rate and rhythm, S1, S2 normal. No murmurs, rubs, or gallops.  ABDOMEN: Soft, nondistended, with mild epigastric and right upper quadrant tenderness without rebound tenderness guarding or rigidity.  Bowel sounds present. No organomegaly or mass.  EXTREMITIES: No pedal edema, cyanosis, or clubbing.  NEUROLOGIC: Cranial nerves II through XII are intact. Muscle strength 5/5 in all extremities. Sensation intact. Gait not checked.  PSYCHIATRIC: The patient is alert and oriented x 3.  Normal affect and good eye contact. SKIN: No obvious rash, lesion, or ulcer.   LABORATORY PANEL:   CBC Recent Labs  Lab 02/09/20 1810  WBC 14.6*  HGB 10.5*  HCT 32.9*  PLT 340   ------------------------------------------------------------------------------------------------------------------  Chemistries  Recent Labs  Lab 02/09/20 2119  NA 135  K 3.1*  CL 98  CO2 26  GLUCOSE 85  BUN 10  CREATININE 0.72  CALCIUM 8.8*  AST 12*  ALT 11  ALKPHOS 50  BILITOT 0.7   ------------------------------------------------------------------------------------------------------------------  Cardiac Enzymes No results for input(s): TROPONINI in the last 168 hours. ------------------------------------------------------------------------------------------------------------------  RADIOLOGY:  CT ABDOMEN PELVIS W CONTRAST  Result Date: 02/09/2020 CLINICAL DATA:  Mid upper abdominal pain. EXAM: CT ABDOMEN AND PELVIS WITH CONTRAST TECHNIQUE: Multidetector CT imaging of the abdomen and pelvis was performed using the standard protocol following bolus administration of intravenous contrast. CONTRAST:  02/11/2020 OMNIPAQUE IOHEXOL  300 MG/ML  SOLN COMPARISON:  None. FINDINGS: Lower chest: No acute abnormality. Hepatobiliary: A 1.0 cm diameter focus of parenchymal low attenuation is seen within the left lobe of the liver (axial CT image 11, CT series number 2). No gallstones, gallbladder wall thickening, or biliary dilatation. Pancreas: Unremarkable. No pancreatic ductal dilatation or surrounding inflammatory changes. Spleen: Normal in size without focal abnormality. Adrenals/Urinary Tract: Adrenal glands are unremarkable. Kidneys are normal, without renal calculi, focal lesion, or hydronephrosis. The urinary bladder is contracted and subsequently limited in evaluation. Stomach/Bowel: Stomach is within normal limits. The appendix is retrocecal in location and normal in appearance (axial CT images 57 through 64, CT series number 2/sagittal reformatted images 44 through 59, CT series number 6). There is no evidence of bowel dilatation. Mild to moderate severity thickening of the ascending and proximal transverse colon is seen. Multiple loops of markedly thickened and inflamed distal and terminal ileum are seen within the right lower quadrant and pelvis. Associated peri intestinal inflammatory fat stranding is present. A 1.9 cm x 1.1 cm x 3.1 cm focus of air is seen within the pelvis on the right. This is adjacent to the previously described loops of inflamed distal ileum and cannot definitively be localized within a loop of bowel (axial CT images 68 through 74, CT series number 2). Vascular/Lymphatic: No significant vascular findings are present. No enlarged abdominal or pelvic lymph nodes. Reproductive: Uterus and bilateral adnexa are unremarkable. Other: No abdominal wall hernia or abnormality. No abdominopelvic ascites. Musculoskeletal: No acute or  significant osseous findings. IMPRESSION: 1. Marked severity enteritis involving multiple loops of distal and terminal ileum. 2. Additional findings worrisome for the presence of a focus of  contained free air within the pelvis, secondary to perforation of the previously noted inflamed small bowel. 3. Findings likely consistent with a small hepatic cyst versus hemangioma. Electronically Signed   By: Aram Candela M.D.   On: 02/09/2020 20:20      IMPRESSION AND PLAN:   1.  Acute severe enteritis with possible small bowel microperforation with subsequent sepsis without severe sepsis or septic shock.  This is manifested by leukocytosis and tachycardia. -The patient will be admitted to a med monitored bed. -Pain management will be provided. -Surgery consultation will be obtained. -I notified Dr. Everlene Farrier about the patient. -Continue IV antibiotic therapy with broad-spectrum coverage including IV cefepime, Flagyl and vancomycin given severity of her enteritis pending blood and stool cultures.  2.  Asthma, currently controlled. -She she has not had any attacks since childhood.  3.  GERD. -She will be placed on IV PPI therapy.  4.  DVT prophylaxis. -Subcutaneous Lovenox    All the records are reviewed and case discussed with ED provider. The plan of care was discussed in details with the patient (and family). I answered all questions. The patient agreed to proceed with the above mentioned plan. Further management will depend upon hospital course.   CODE STATUS: Full code  Status is: Inpatient  Remains inpatient appropriate because:Ongoing active pain requiring inpatient pain management, Ongoing diagnostic testing needed not appropriate for outpatient work up, Unsafe d/c plan, IV treatments appropriate due to intensity of illness or inability to take PO and Inpatient level of care appropriate due to severity of illness.  The patient will be more than 2 midnights especially given severity of her enteritis and suspected small bowel perforation.   Dispo: The patient is from: Home              Anticipated d/c is to: Home              Anticipated d/c date is: 2 days               Patient currently is not medically stable to d/c.   TOTAL TIME TAKING CARE OF THIS PATIENT: 55 minutes.    Hannah Beat M.D on 02/09/2020 at 10:48 PM  [facility]   From 7 PM-7 AM, contact night-coverage [email or web address]  CC: Primary care physician; Patient, No Pcp Per

## 2020-02-09 NOTE — ED Triage Notes (Signed)
Pt presents to ED via POV with c/o mid upper abdominal pain. Pt states pain since Thursday. Pt states had some vomiting on Monday, and today has had several episodes of diarrhea. Pt A&O x4, skin warm, dry, and intact in triage.

## 2020-02-09 NOTE — Consult Note (Signed)
Pharmacy Antibiotic Note  Megan Haynes is a 24 y.o. female admitted on 02/09/2020 with Intra-abdominal infection.  Pharmacy has been consulted for cefepime dosing.  Plan: Cefepime 2 g q8H - follow up with Scr and adjust as appropriate.   Height: 5\' 5"  (165.1 cm) Weight: (!) 147.4 kg (325 lb) IBW/kg (Calculated) : 57  Temp (24hrs), Avg:98.7 F (37.1 C), Min:98.7 F (37.1 C), Max:98.7 F (37.1 C)  Recent Labs  Lab 02/09/20 1810 02/09/20 2050  WBC 14.6*  --   LATICACIDVEN  --  0.9    CrCl cannot be calculated (No successful lab value found.).    No Known Allergies  Antimicrobials this admission: 10/19 pip/tazo x 1 10/19 cefepime >>  10/19 flagyl >>   Dose adjustments this admission: none  Microbiology results: 10/19 BCx: pending   Thank you for allowing pharmacy to be a part of this patient's care.  11/19 02/09/2020 10:06 PM

## 2020-02-09 NOTE — ED Provider Notes (Addendum)
[facility] Emergency Department Provider Note  ____________________________________________   First MD Initiated Contact with Patient 02/09/20 1800.   I have reviewed the triage vital signs and the nursing notes.   HISTORY  Chief Complaint Abdominal Pain   HPI Megan Haynes is a 24 y.o. female with a past medical history of asthma who presents for assessment approximately 5 to 6 days of some periumbilical abdominal pain that she describes as crampy and constant and rating to her back.  She states she had some nonbloody nonbilious vomiting yesterday and some nonbloody diarrhea last couple days but none today.  She endorses some urinary hesitancy but denies any significant pain or blood in her urine.  She states this is similar to an episode that occurred last year in June and she was seen by GI doctor who diagnosed her with possible inflammatory bowel disease but she feels she is not getting better despite taking simethicone and antihistamines.  Patient denies any fevers, chills, headache, earache, sore throat, chest pain, cough, shortness of breath, back pain, rash, extremity pain, or other acute complaints.  Denies significant NSAID use, EtOH use, or illicit drug use.         Past Medical History:  Diagnosis Date  . Asthma     Patient Active Problem List   Diagnosis Date Noted  . Small bowel perforation (HCC) 02/09/2020    History reviewed. No pertinent surgical history.  Prior to Admission medications   Medication Sig Start Date End Date Taking? Authorizing Provider  HYDROcodone-acetaminophen (NORCO/VICODIN) 5-325 MG tablet Take 1-2 tablets by mouth every 4 (four) hours as needed for moderate pain. 11/05/15   Loleta Rose, MD    Allergies Patient has no known allergies.  No family history on file.  Social History Social History   Tobacco Use  . Smoking status: Never Smoker  . Smokeless tobacco: Never Used  Substance Use Topics  .  Alcohol use: Not Currently  . Drug use: Not Currently    Review of Systems  Review of Systems  Constitutional: Negative for chills and fever.  HENT: Negative for sore throat.   Eyes: Negative for pain.  Respiratory: Negative for cough and stridor.   Cardiovascular: Negative for chest pain.  Gastrointestinal: Positive for abdominal pain, diarrhea, nausea and vomiting.  Skin: Negative for rash.  Neurological: Negative for seizures, loss of consciousness and headaches.  Psychiatric/Behavioral: Negative for suicidal ideas.  All other systems reviewed and are negative.     ____________________________________________   PHYSICAL EXAM:  VITAL SIGNS: ED Triage Vitals  Enc Vitals Group     BP 02/09/20 1800 (!) 126/98     Pulse Rate 02/09/20 1800 (!) 118     Resp 02/09/20 1800 16     Temp 02/09/20 1800 98.7 F (37.1 C)     Temp Source 02/09/20 1800 Oral     SpO2 02/09/20 1800 98 %     Weight 02/09/20 1801 (!) 325 lb (147.4 kg)     Height 02/09/20 1801 5\' 5"  (1.651 m)     Head Circumference --      Peak Flow --      Pain Score 02/09/20 1808 5     Pain Loc --      Pain Edu? --      Excl. in GC? --    Vitals:   02/09/20 1800  BP: (!) 126/98  Pulse: (!) 118  Resp: 16  Temp: 98.7 F (37.1 C)  SpO2: 98%  Physical Exam Vitals and nursing note reviewed.  Constitutional:      General: She is not in acute distress.    Appearance: She is well-developed. She is obese.  HENT:     Head: Normocephalic and atraumatic.     Right Ear: External ear normal.     Left Ear: External ear normal.     Nose: Nose normal.     Mouth/Throat:     Mouth: Mucous membranes are dry.  Eyes:     Conjunctiva/sclera: Conjunctivae normal.  Cardiovascular:     Rate and Rhythm: Regular rhythm. Tachycardia present.     Heart sounds: No murmur heard.   Pulmonary:     Effort: Pulmonary effort is normal. No respiratory distress.     Breath sounds: Normal breath sounds.  Abdominal:     Palpations:  Abdomen is soft.     Tenderness: There is generalized abdominal tenderness. There is no right CVA tenderness or left CVA tenderness.  Musculoskeletal:     Cervical back: Neck supple.  Skin:    General: Skin is warm and dry.     Capillary Refill: Capillary refill takes 2 to 3 seconds.  Neurological:     Mental Status: She is alert and oriented to person, place, and time.  Psychiatric:        Mood and Affect: Mood normal.      ____________________________________________   LABS (all labs ordered are listed, but only abnormal results are displayed)  Labs Reviewed  CBC - Abnormal; Notable for the following components:      Result Value   WBC 14.6 (*)    Hemoglobin 10.5 (*)    HCT 32.9 (*)    MCV 71.5 (*)    MCH 22.8 (*)    All other components within normal limits  URINALYSIS, COMPLETE (UACMP) WITH MICROSCOPIC - Abnormal; Notable for the following components:   Color, Urine AMBER (*)    APPearance CLOUDY (*)    Specific Gravity, Urine 1.031 (*)    Ketones, ur 80 (*)    Protein, ur 100 (*)    Bacteria, UA RARE (*)    All other components within normal limits  COMPREHENSIVE METABOLIC PANEL - Abnormal; Notable for the following components:   Potassium 3.1 (*)    Calcium 8.8 (*)    Albumin 3.2 (*)    AST 12 (*)    All other components within normal limits  GASTROINTESTINAL PANEL BY PCR, STOOL (REPLACES STOOL CULTURE)  CULTURE, BLOOD (ROUTINE X 2)  CULTURE, BLOOD (ROUTINE X 2)  C DIFFICILE QUICK SCREEN W PCR REFLEX  RESPIRATORY PANEL BY RT PCR (FLU A&B, COVID)  PREGNANCY, URINE  LACTIC ACID, PLASMA  LIPASE, BLOOD  H. PYLORI ANTIGEN, STOOL  LACTIC ACID, PLASMA  HIV ANTIBODY (ROUTINE TESTING W REFLEX)  PROTIME-INR  CORTISOL-AM, BLOOD  PROCALCITONIN  CBC  COMPREHENSIVE METABOLIC PANEL  POC URINE PREG, ED   ____________________________________________ ____________________________________________  RADIOLOGY  ED MD interpretation: Severe enteritis with possible  small small bowel perforation.  No evidence of appendicitis, pyelonephritis, acute pancreatitis, or cholecystitis.  Official radiology report(s): CT ABDOMEN PELVIS W CONTRAST  Result Date: 02/09/2020 CLINICAL DATA:  Mid upper abdominal pain. EXAM: CT ABDOMEN AND PELVIS WITH CONTRAST TECHNIQUE: Multidetector CT imaging of the abdomen and pelvis was performed using the standard protocol following bolus administration of intravenous contrast. CONTRAST:  OMNIPAQUE IOHEXOL 300 MG/ML  SOLN COMPARISON:  None. FINDINGS: Lower chest: No acute abnormality. Hepatobiliary: A 1.0 cm diameter focus of parenchymal  low attenuation is seen within the left lobe of the liver (axial CT image 11, CT series number 2). No gallstones, gallbladder wall thickening, or biliary dilatation. Pancreas: Unremarkable. No pancreatic ductal dilatation or surrounding inflammatory changes. Spleen: Normal in size without focal abnormality. Adrenals/Urinary Tract: Adrenal glands are unremarkable. Kidneys are normal, without renal calculi, focal lesion, or hydronephrosis. The urinary bladder is contracted and subsequently limited in evaluation. Stomach/Bowel: Stomach is within normal limits. The appendix is retrocecal in location and normal in appearance (axial CT images 57 through 64, CT series number 2/sagittal reformatted images 44 through 59, CT series number 6). There is no evidence of bowel dilatation. Mild to moderate severity thickening of the ascending and proximal transverse colon is seen. Multiple loops of markedly thickened and inflamed distal and terminal ileum are seen within the right lower quadrant and pelvis. Associated peri intestinal inflammatory fat stranding is present. A 1.9 cm x 1.1 cm x 3.1 cm focus of air is seen within the pelvis on the right. This is adjacent to the previously described loops of inflamed distal ileum and cannot definitively be localized within a loop of bowel (axial CT images 68 through 74, CT series  number 2). Vascular/Lymphatic: No significant vascular findings are present. No enlarged abdominal or pelvic lymph nodes. Reproductive: Uterus and bilateral adnexa are unremarkable. Other: No abdominal wall hernia or abnormality. No abdominopelvic ascites. Musculoskeletal: No acute or significant osseous findings. IMPRESSION: 1. Marked severity enteritis involving multiple loops of distal and terminal ileum. 2. Additional findings worrisome for the presence of a focus of contained free air within the pelvis, secondary to perforation of the previously noted inflamed small bowel. 3. Findings likely consistent with a small hepatic cyst versus hemangioma. Electronically Signed   By: Aram Candela M.D.   On: 02/09/2020 20:20    ____________________________________________   PROCEDURES  Procedure(s) performed (including Critical Care):  .Critical Care Performed by: Gilles Chiquito, MD Authorized by: Gilles Chiquito, MD   Critical care provider statement:    Critical care time (minutes):  45   Critical care time was exclusive of:  Separately billable procedures and treating other patients   Critical care was necessary to treat or prevent imminent or life-threatening deterioration of the following conditions:  Sepsis   Critical care was time spent personally by me on the following activities:  Discussions with consultants, evaluation of patient's response to treatment, examination of patient, ordering and performing treatments and interventions, ordering and review of laboratory studies, ordering and review of radiographic studies, pulse oximetry, re-evaluation of patient's condition, obtaining history from patient or surrogate and review of old charts     ____________________________________________   INITIAL IMPRESSION / ASSESSMENT AND PLAN / ED COURSE        Patient presents with left to history exam for assessment of some abdominal pain that has been persistent over the last 5 to 6  days.  Patient is slightly tachycardic with a heart rate of 118 with otherwise stable vital signs on room air.  Exam as above remarkable for some generalized abdominal tenderness without guarding and no CVA tenderness.  Patient has dry mucous membranes and slightly decreased cap refill.  Differential includes but is not limited to acute cholecystitis, pancreatitis, appendicitis, diverticulitis, constipation, gastritis, torsion, and pyelonephritis.  CT obtained does show findings concerning for severe enteritis involving multiple loops of the distal and terminal ileum with possible perforation noted.  Patient also has a hepatic cyst versus hemangioma noted.  No evidence of  appendicitis, pyelonephritis, acute pancreatitis, ovarian torsion, or other acute intra-abdominal pathology.  Urine has ketones and protein which are suggestive of some dehydration but no clear evidence of infection.  CBC remarkable for leukocytosis with a WBC count of 14.6 and some mild anemia with a hemoglobin of 10.5.  This hemoglobin is compared to that obtained on 09/15/2019 that showed hemoglobin of 10.8.  This appears stable.  Given tachycardia on arrival with elevated white blood cell count and concern for possible intestinal perforation patient was made code sepsis and blood cultures were obtained patient was given broad-spectrum IV antibiotic noted below as well as IV fluids.  I did discuss patient's presentation work-up with on-call surgeon Dr. Everlene Farrier as well as on-call gastroenterologist Dr. Wyline Mood who both recommended medicine admission and IV antibiotics.  Both consultants stated they would see the patient in the morning and follow along.  I will plan to admit the patient to hospitalist service for further evaluation and management.  ____________________________________________   FINAL CLINICAL IMPRESSION(S) / ED DIAGNOSES  Final diagnoses:  Enteritis  Bowel perforation (HCC)  Sepsis, due to unspecified organism,  unspecified whether acute organ dysfunction present (HCC)    Medications  HYDROmorphone (DILAUDID) injection 0.5 mg (has no administration in time range)  lactated ringers infusion (has no administration in time range)  lactated ringers bolus 1,000 mL (has no administration in time range)  enoxaparin (LOVENOX) injection 72.5 mg (has no administration in time range)  sodium chloride flush (NS) 0.9 % injection 3 mL (3 mLs Intravenous Not Given 02/09/20 2217)  sodium chloride flush (NS) 0.9 % injection 3 mL (has no administration in time range)  0.9 %  sodium chloride infusion (has no administration in time range)  0.9 %  sodium chloride infusion (has no administration in time range)  ceFEPIme (MAXIPIME) 2 g in sodium chloride 0.9 % 100 mL IVPB (has no administration in time range)  metroNIDAZOLE (FLAGYL) IVPB 500 mg (has no administration in time range)  acetaminophen (TYLENOL) tablet 650 mg (has no administration in time range)    Or  acetaminophen (TYLENOL) suppository 650 mg (has no administration in time range)  traZODone (DESYREL) tablet 25 mg (has no administration in time range)  ondansetron (ZOFRAN) tablet 4 mg (has no administration in time range)    Or  ondansetron (ZOFRAN) injection 4 mg (has no administration in time range)  lactated ringers bolus 1,000 mL (1,000 mLs Intravenous New Bag/Given 02/09/20 1929)  iohexol (OMNIPAQUE) 300 MG/ML solution 125 mL (125 mLs Intravenous Contrast Given 02/09/20 1948)  piperacillin-tazobactam (ZOSYN) IVPB 3.375 g (0 g Intravenous Stopped 02/09/20 2144)     ED Discharge Orders    None       Note:  This document was prepared using Dragon voice recognition software and may include unintentional dictation errors.   Gilles Chiquito, MD 02/09/20 2104    Gilles Chiquito, MD 02/09/20 2227

## 2020-02-09 NOTE — Consult Note (Signed)
CODE SEPSIS - PHARMACY COMMUNICATION  **Broad Spectrum Antibiotics should be administered within 1 hour of Sepsis diagnosis**  Time Code Sepsis Called/Page Received: 2041  Antibiotics Ordered: pip/tazp  Time of 1st antibiotic administration: 2049  Additional action taken by pharmacy: none  Ronnald Ramp ,PharmD Clinical Pharmacist  02/09/2020  9:01 PM

## 2020-02-09 NOTE — ED Notes (Signed)
Pt states driving home, wants to hold on narcotic pain medicine.

## 2020-02-10 DIAGNOSIS — A419 Sepsis, unspecified organism: Secondary | ICD-10-CM | POA: Insufficient documentation

## 2020-02-10 DIAGNOSIS — K631 Perforation of intestine (nontraumatic): Secondary | ICD-10-CM

## 2020-02-10 DIAGNOSIS — K529 Noninfective gastroenteritis and colitis, unspecified: Secondary | ICD-10-CM

## 2020-02-10 LAB — COMPREHENSIVE METABOLIC PANEL
ALT: 11 U/L (ref 0–44)
AST: 11 U/L — ABNORMAL LOW (ref 15–41)
Albumin: 2.8 g/dL — ABNORMAL LOW (ref 3.5–5.0)
Alkaline Phosphatase: 47 U/L (ref 38–126)
Anion gap: 10 (ref 5–15)
BUN: 8 mg/dL (ref 6–20)
CO2: 26 mmol/L (ref 22–32)
Calcium: 8.5 mg/dL — ABNORMAL LOW (ref 8.9–10.3)
Chloride: 101 mmol/L (ref 98–111)
Creatinine, Ser: 0.71 mg/dL (ref 0.44–1.00)
GFR, Estimated: >60 mL/min (ref >60)
Glucose, Bld: 92 mg/dL (ref 70–99)
Potassium: 3 mmol/L — ABNORMAL LOW (ref 3.5–5.1)
Sodium: 137 mmol/L (ref 135–145)
Total Bilirubin: 0.7 mg/dL (ref 0.3–1.2)
Total Protein: 6.6 g/dL (ref 6.5–8.1)

## 2020-02-10 LAB — FERRITIN: Ferritin: 73 ng/mL (ref 11–307)

## 2020-02-10 LAB — IRON AND TIBC
Iron: 14 ug/dL — ABNORMAL LOW (ref 28–170)
Saturation Ratios: 6 % — ABNORMAL LOW (ref 10.4–31.8)
TIBC: 251 ug/dL (ref 250–450)
UIBC: 237 ug/dL

## 2020-02-10 LAB — CBC
HCT: 28 % — ABNORMAL LOW (ref 36.0–46.0)
Hemoglobin: 9.2 g/dL — ABNORMAL LOW (ref 12.0–15.0)
MCH: 23.4 pg — ABNORMAL LOW (ref 26.0–34.0)
MCHC: 32.9 g/dL (ref 30.0–36.0)
MCV: 71.1 fL — ABNORMAL LOW (ref 80.0–100.0)
Platelets: 283 10*3/uL (ref 150–400)
RBC: 3.94 MIL/uL (ref 3.87–5.11)
RDW: 15.1 % (ref 11.5–15.5)
WBC: 10.6 10*3/uL — ABNORMAL HIGH (ref 4.0–10.5)
nRBC: 0 % (ref 0.0–0.2)

## 2020-02-10 LAB — PROCALCITONIN: Procalcitonin: <0.1 ng/mL

## 2020-02-10 LAB — HIV ANTIBODY (ROUTINE TESTING W REFLEX): HIV Screen 4th Generation wRfx: NONREACTIVE

## 2020-02-10 LAB — FOLATE: Folate: 18.3 ng/mL (ref >5.9)

## 2020-02-10 LAB — CULTURE, BLOOD (ROUTINE X 2): Culture: NO GROWTH

## 2020-02-10 LAB — PROTIME-INR
INR: 1.2 (ref 0.8–1.2)
Prothrombin Time: 14.9 seconds (ref 11.4–15.2)

## 2020-02-10 LAB — CORTISOL-AM, BLOOD: Cortisol - AM: 5.4 ug/dL — ABNORMAL LOW (ref 6.7–22.6)

## 2020-02-10 LAB — LACTIC ACID, PLASMA: Lactic Acid, Venous: 0.7 mmol/L (ref 0.5–1.9)

## 2020-02-10 NOTE — Consult Note (Signed)
Patient ID: Megan Haynes, female   DOB: 07/24/1995, 24 y.o.   MRN: [id number]  HPI Megan Haynes is a 24 y.o. female seen in consultation at the request of Dr. Arville Care for abdominal pain ( d/w him in detail).  He reports that she has had the pain for over a year is usually lower abdomen.  Today she came in with worsening abdominal pain. Started periumbilical area and radiated to the lower abdomen.  It is colicky intermittent and moderate intensity.  She did have nausea vomiting as well as diarrhea.  He has been seen by Mr. Genelle Bal at [facility] GI.  She is frustrated with her current GI provider. He did have a CT scan that I have personally reviewed showing evidence of small bowel inflammation with a very small contained microperforation this changes are consistent with Crohn's disease.  No evidence of any drainable abscess. Oratory values included MP showing mild hypokalemia and hypoalbuminemia, C shows anemia on hemoglobin of 10 and a white count of 14.6.  She denies any previous abdominal operations.  She is able to perform more than 4 METS of activity without any shortness of breath or chest pain.   HPI  Past Medical History:  Diagnosis Date  . Asthma     History reviewed. No pertinent surgical history.  Irving Burton history is significant for a sister with Crohn's disease Social History Social History   Tobacco Use  . Smoking status: Never Smoker  . Smokeless tobacco: Never Used  Substance Use Topics  . Alcohol use: Not Currently  . Drug use: Not Currently    No Known Allergies  Current Facility-Administered Medications  Medication Dose Route Frequency Provider Last Rate Last Admin  . 0.9 %  sodium chloride infusion  250 mL Intravenous PRN Mansy, Jan A, MD      . 0.9 %  sodium chloride infusion   Intravenous Continuous Mansy, Jan A, MD 100 mL/hr at 02/09/20 2254 New Bag at 02/09/20 2254  . acetaminophen (TYLENOL) tablet 650 mg  650 mg Oral Q6H PRN Mansy, Jan A, MD       Or   . acetaminophen (TYLENOL) suppository 650 mg  650 mg Rectal Q6H PRN Mansy, Jan A, MD      . ceFEPIme (MAXIPIME) 2 g in sodium chloride 0.9 % 100 mL IVPB  2 g Intravenous Q8H Mansy, Jan A, MD      . enoxaparin (LOVENOX) injection 72.5 mg  0.5 mg/kg Subcutaneous Q24H Mansy, Jan A, MD      . HYDROmorphone (DILAUDID) injection 0.5 mg  0.5 mg Intravenous Once Gilles Chiquito, MD      . metroNIDAZOLE (FLAGYL) IVPB 500 mg  500 mg Intravenous Q8H Mansy, Jan A, MD      . morphine 2 MG/ML injection 2 mg  2 mg Intravenous Q4H PRN Mansy, Jan A, MD      . ondansetron [facility]) tablet 4 mg  4 mg Oral Q6H PRN Mansy, Jan A, MD       Or  . ondansetron [facility]) injection 4 mg  4 mg Intravenous Q6H PRN Mansy, Jan A, MD      . sodium chloride flush (NS) 0.9 % injection 3 mL  3 mL Intravenous Q12H Mansy, Jan A, MD      . sodium chloride flush (NS) 0.9 % injection 3 mL  3 mL Intravenous PRN Mansy, Jan A, MD      . traZODone (DESYREL) tablet 25 mg  25 mg Oral  QHS PRN Mansy, Vernetta Honey, MD       No current outpatient medications on file.     Review of Systems Full ROS  was asked and was negative except for the information on the HPI  Physical Exam Blood pressure 126/78, pulse 83, temperature 98.7 F (37.1 C), temperature source Oral, resp. rate 16, height 5\' 5"  (1.651 m), weight (!) 147.4 kg, last menstrual period 01/03/2020, SpO2 98 %. CONSTITUTIONAL: NAD. EYES: Pupils are equal, round, and reactive to light, Sclera are non-icteric. EARS, NOSE, MOUTH AND THROAT: sHe is wearing a mask hearing is intact to voice. LYMPH NODES:  Lymph nodes in the neck are normal. RESPIRATORY:  Lungs are clear. There is normal respiratory effort, with equal breath sounds bilaterally, and without pathologic use of accessory muscles. CARDIOVASCULAR: Heart is regular without murmurs, gallops, or rubs. GI: The abdomen is  soft, nontender, and nondistended. There are no palpable masses. There is no hepatosplenomegaly. There are normal  bowel sounds in all quadrants. GU: Rectal deferred.   MUSCULOSKELETAL: Normal muscle strength and tone. No cyanosis or edema.   SKIN: Turgor is good and there are no pathologic skin lesions or ulcers. NEUROLOGIC: Motor and sensation is grossly normal. Cranial nerves are grossly intact. PSYCH:  Oriented to person, place and time. Affect is normal.  Data Reviewed  I have personally reviewed the patient's imaging, laboratory findings and medical records.    Assessment  /Plan 24 year old female with chronic abdominal pain and CT and clinical findings consistent with Crohn's disease.  There is evidence of significant small bowel inflammation within the pelvis and there is an contained small perforation.  Them is completely benign and she does not have any evidence of peritonitis or sepsis.  I recommend medical management with IV antibiotics n.p.o. and holding any steroids if possible.  The patient is frustrated at her prior GI provider and wishes to seek a second opinion.  I did offer that Dr. 22 is our IBD specialist and she is thrilled to hear that she is around.  We will get in touch with GI regarding this case. No need for surgical intervention.  We will continue to follow her  Allegra Lai, MD FACS General Surgeon 02/10/2020, 3:18 AM

## 2020-02-10 NOTE — Progress Notes (Addendum)
[facility]  - [place] at [facility]   PATIENT NAME: Megan Haynes    MR#:  [id number]  DATE OF BIRTH:  1995-12-12  SUBJECTIVE:  patient came in with intermittent abdominal pain for one year. She was being followed by [facility] Came in with significant abdominal pain nausea vomiting and diarrhea. Feels a lot better after treatment in the emergency room  today feels well. Hungry asking when she can eat. No fever. No family in the room.  REVIEW OF SYSTEMS:   Review of Systems  Constitutional: Negative for chills, fever and weight loss.  HENT: Negative for ear discharge, ear pain and nosebleeds.   Eyes: Negative for blurred vision, pain and discharge.  Respiratory: Negative for sputum production, shortness of breath, wheezing and stridor.   Cardiovascular: Negative for chest pain, palpitations, orthopnea and PND.  Gastrointestinal: Positive for abdominal pain and diarrhea. Negative for nausea and vomiting.  Genitourinary: Negative for frequency and urgency.  Musculoskeletal: Negative for back pain and joint pain.  Neurological: Negative for sensory change, speech change, focal weakness and weakness.  Psychiatric/Behavioral: Negative for depression and hallucinations. The patient is not nervous/anxious.    Tolerating Diet:npo Tolerating PT: ambulatory  DRUG ALLERGIES:  No Known Allergies  VITALS:  Blood pressure 127/79, pulse 80, temperature 98.7 F (37.1 C), temperature source Oral, resp. rate 18, height 5\' 5"  (1.651 m), weight (!) 147.4 kg, last menstrual period 01/03/2020, SpO2 98 %.  PHYSICAL EXAMINATION:   Physical Exam  GENERAL:  24 y.o.-year-old patient lying in the bed with no acute distress. Morbidly obese  HEENT: Head atraumatic, normocephalic. Oropharynx and nasopharynx clear.  NECK:  Supple, no jugular venous distention. No thyroid enlargement, no tenderness.  LUNGS: Normal breath sounds bilaterally, no wheezing, rales, rhonchi. No use of accessory  muscles of respiration.  CARDIOVASCULAR: S1, S2 normal. No murmurs, rubs, or gallops.  ABDOMEN: Soft, mild diffusely tender, nondistended. Bowel sounds present. No organomegaly or mass.  EXTREMITIES: No cyanosis, clubbing or edema b/l.    NEUROLOGIC: Cranial nerves II through XII are intact. No focal Motor or sensory deficits b/l.   PSYCHIATRIC:  patient is alert and oriented x 3.  SKIN: No obvious rash, lesion, or ulcer.   LABORATORY PANEL:  CBC Recent Labs  Lab 02/10/20 0506  WBC 10.6*  HGB 9.2*  HCT 28.0*  PLT 283    Chemistries  Recent Labs  Lab 02/10/20 0506  NA 137  K 3.0*  CL 101  CO2 26  GLUCOSE 92  BUN 8  CREATININE 0.71  CALCIUM 8.5*  AST 11*  ALT 11  ALKPHOS 47  BILITOT 0.7   Cardiac Enzymes No results for input(s): TROPONINI in the last 168 hours. RADIOLOGY:  CT ABDOMEN PELVIS W CONTRAST  Result Date: 02/09/2020 CLINICAL DATA:  Mid upper abdominal pain. EXAM: CT ABDOMEN AND PELVIS WITH CONTRAST TECHNIQUE: Multidetector CT imaging of the abdomen and pelvis was performed using the standard protocol following bolus administration of intravenous contrast. CONTRAST:  02/11/2020 OMNIPAQUE IOHEXOL 300 MG/ML  SOLN COMPARISON:  None. FINDINGS: Lower chest: No acute abnormality. Hepatobiliary: A 1.0 cm diameter focus of parenchymal low attenuation is seen within the left lobe of the liver (axial CT image 11, CT series number 2). No gallstones, gallbladder wall thickening, or biliary dilatation. Pancreas: Unremarkable. No pancreatic ductal dilatation or surrounding inflammatory changes. Spleen: Normal in size without focal abnormality. Adrenals/Urinary Tract: Adrenal glands are unremarkable. Kidneys are normal, without renal calculi, focal lesion, or hydronephrosis. The urinary bladder  is contracted and subsequently limited in evaluation. Stomach/Bowel: Stomach is within normal limits. The appendix is retrocecal in location and normal in appearance (axial CT images 57 through  64, CT series number 2/sagittal reformatted images 44 through 59, CT series number 6). There is no evidence of bowel dilatation. Mild to moderate severity thickening of the ascending and proximal transverse colon is seen. Multiple loops of markedly thickened and inflamed distal and terminal ileum are seen within the right lower quadrant and pelvis. Associated peri intestinal inflammatory fat stranding is present. A 1.9 cm x 1.1 cm x 3.1 cm focus of air is seen within the pelvis on the right. This is adjacent to the previously described loops of inflamed distal ileum and cannot definitively be localized within a loop of bowel (axial CT images 68 through 74, CT series number 2). Vascular/Lymphatic: No significant vascular findings are present. No enlarged abdominal or pelvic lymph nodes. Reproductive: Uterus and bilateral adnexa are unremarkable. Other: No abdominal wall hernia or abnormality. No abdominopelvic ascites. Musculoskeletal: No acute or significant osseous findings. IMPRESSION: 1. Marked severity enteritis involving multiple loops of distal and terminal ileum. 2. Additional findings worrisome for the presence of a focus of contained free air within the pelvis, secondary to perforation of the previously noted inflamed small bowel. 3. Findings likely consistent with a small hepatic cyst versus hemangioma. Electronically Signed   By: Aram Candela M.D.   On: 02/09/2020 20:20   ASSESSMENT AND PLAN:  Megan Haynes  is a 24 y.o. African-American female with a known history of asthma, who presented to the emergency room with acute onset of periumbilical and upper abdominal pain for the last 5 to 6 days which has been worsening.  She describes her pain as crampy and constant with radiation to her back.  It was associated with nausea and vomiting on Monday as well as diarrhea for the last couple of days  1.  Acute severe enteritis with possible small bowel microperforation with subsequent sepsis without  organ dysfunction.  This is manifested by leukocytosis and tachycardia. -Likely Crohn's disease (and has family history of Crohn's disease) -Continue IV fluids -Pain management will be provided. -Surgery consultation is appreciated. Recommends Dr. Allegra Lai from G.I. to see -Continue IV antibiotic therapy with broad-spectrum coverage including IV cefepime, Flagyl  -stool for C. diff pending. No diarrhea so far today. -Blood cultures negative.   2.  Asthma, currently controlled. -She she has not had any attacks since childhood.  3.  GERD. -on IV PPI therapy.  4.  DVT prophylaxis. -Subcutaneous Lovenox  5. Body mass index is 54.08 kg/m. morbid obesity -patient advised lifestyle change with diet and exercise    Procedures: none Family communication : none Consults : surgery, [facility].I. CODE STATUS: full DVT Prophylaxis : Lovenox  Status is: Inpatient  Remains inpatient appropriate because:IV treatments appropriate due to intensity of illness or inability to take PO and Inpatient level of care appropriate due to severity of illness   Dispo: The patient is from: Home              Anticipated d/c is to: Home              Anticipated d/c date is: 1 day              Patient currently is not medically stable to d/c. patient admitted with severe enteritis likely new diagnosis of Crohn's disease. To be seen by G.[facility]. Currently NPO. Expected date of discharge 1 to 2  days pending clinical improvement.       TOTAL TIME TAKING CARE OF THIS PATIENT: 25 minutes.  >50% time spent on counselling and coordination of care  Note: This dictation was prepared with Dragon dictation along with smaller phrase technology. Any transcriptional errors that result from this process are unintentional.  Enedina Finner M.D    [facility]   CC: Primary care physician; Patient, No Pcp PerPatient ID: Megan Haynes, female   DOB: [birth year], 24 y.o.   MRN: [id number]

## 2020-02-10 NOTE — Consult Note (Signed)
Cephas Darby, MD [address]  [place]  [place], [place] [address]  Main: [phone]  Fax: [phone] Pager: [phone]   Consultation  Referring Provider:     No ref. provider found Primary Care Physician:  Patient, No Pcp Per Primary Gastroenterologist:  Dr. Alice Reichert         Reason for Consultation:     Probable Crohn's  Date of Admission:  02/09/2020 Date of Consultation:  02/10/2020         HPI:   Megan Haynes is a 24 y.o. African-American female with history of obesity who presented to ER last night secondary to central abdominal pain, associated with nausea, vomiting as well as nonbloody diarrhea.  Patient denied fever, chills, dysuria, perianal fistula, rectal bleeding, eye symptoms.  Does report joint pains in bilateral hips and knees. She is found to have leukocytosis, microcytic anemia, tachycardia and CT revealed severely inflamed distal and terminal ileal loops with contained perforation as well as inflammation of the right colon.  Surgery has been consulted, patient is evaluated by Dr. Dahlia Byes, there was no emergent or urgent indication for surgery at this time.  Patient is started on IV antibiotics.  GI is consulted to evaluate for probable Crohn's.  Patient is followed by [facility] since June 2020 when she was experiencing 1 month history of abdominal pain associated with nausea, abdominal bloating and diarrhea.  She was treated as IBS with antispasmodic agents as well as PPI.  She is also found to have iron deficiency anemia, has been taking oral iron.  Patient was taking antibiotics intermittently.  Initial set of labs during that visit revealed severe microcytic anemia 9.7, significantly elevated CRP 173 mg/L, ESR 83.  She had a follow-up visit in May 2021 secondary to flareup of above symptoms, underwent repeat labs, found to be persistently anemic, elevated ESR and CRP which was slightly improved.  She was started on hyoscyamine.  There was a  tentative plan to perform endoscopic evaluation to evaluate for iron deficiency anemia if her symptoms are persistent.  Patient developed recurrent flareups since then until this admission.  During this admission, patient is kept n.p.o., started on IV fluids, antibiotics, DVT prophylaxis.  She reports feeling significantly better since admission.  Tachycardia resolved, her GI symptoms have resolved and she wishes to go home.  Her fianc is bedside.   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 Asthma     History reviewed. No pertinent surgical history.  Prior to Admission medications   Not on File    No family history on file.   Social History   Tobacco Use  . Smoking status: Never Smoker  . Smokeless tobacco: Never Used  Substance Use Topics  . Alcohol use: Not Currently  . Drug use: Not Currently    Allergies as of 02/09/2020  . (No Known Allergies)    Review of Systems:    All systems reviewed and negative except where noted in HPI.   Physical Exam:  Vital signs in last 24 hours: Temp:  [98.7 F (37.1 C)] 98.7 F (37.1 C) (10/19 1800) Pulse Rate:  [80-118] 80 (10/20 0805) Resp:  [16-18] 18 (10/20 0805) BP: (126-128)/(77-98) 127/79 (10/20 0805) SpO2:  [97 %-100 %] 98 % (  10/20 0215) Weight:  [147.4 kg] 147.4 kg (10/19 1801)   General:   Pleasant, cooperative in NAD Head:  Normocephalic and atraumatic. Eyes:   No icterus.   Conjunctiva pink. PERRLA. Ears:  Normal auditory acuity. Neck:  Supple; no masses or thyroidomegaly Lungs: Respirations even and unlabored. Lungs clear to auscultation bilaterally.   No wheezes, crackles, or rhonchi.  Heart:  Regular rate and rhythm;  Without murmur, clicks, rubs or gallops Abdomen:  Soft,  nondistended, nontender. Normal bowel sounds. No appreciable masses or hepatomegaly.  No rebound or guarding.  Rectal:  Not performed. Msk:  Symmetrical without gross deformities.  Strength normal Extremities:  Without edema, cyanosis or clubbing. Neurologic:  Alert and oriented x3;  grossly normal neurologically. Skin:  Intact without significant lesions or rashes. Cervical Nodes:  No significant cervical adenopathy. Psych:  Alert and cooperative. Normal affect.  LAB RESULTS: CBC Latest Ref Rng & Units 02/10/2020 02/09/2020  WBC 4.0 - 10.5 K/uL 10.6(H) 14.6(H)  Hemoglobin 12.0 - 15.0 g/dL 9.2(L) 10.5(L)  Hematocrit 36 - 46 % 28.0(L) 32.9(L)  Platelets 150 - 400 K/uL 283 340    BMET BMP Latest Ref Rng & Units 02/10/2020 02/09/2020  Glucose 70 - 99 mg/dL 92 85  BUN 6 - 20 mg/dL 8 10  Creatinine 0.44 - 1.00 mg/dL 0.71 0.72  Sodium 135 - 145 mmol/L 137 135  Potassium 3.5 - 5.1 mmol/L 3.0(L) 3.1(L)  Chloride 98 - 111 mmol/L 101 98  CO2 22 - 32 mmol/L 26 26  Calcium 8.9 - 10.3 mg/dL 8.5(L) 8.8(L)    LFT Hepatic Function Latest Ref Rng & Units 02/10/2020 02/09/2020  Total Protein 6.5 - 8.1 g/dL 6.6 7.5  Albumin 3.5 - 5.0 g/dL 2.8(L) 3.2(L)  AST 15 - 41 U/L 11(L) 12(L)  ALT 0 - 44 U/L 11 11  Alk Phosphatase 38 - 126 U/L 47 50  Total Bilirubin 0.3 - 1.2 mg/dL 0.7 0.7     STUDIES: CT ABDOMEN PELVIS W CONTRAST  Result Date: 02/09/2020 CLINICAL DATA:  Mid upper abdominal pain. EXAM: CT ABDOMEN AND PELVIS WITH CONTRAST TECHNIQUE: Multidetector CT imaging of the abdomen and pelvis was performed using the standard protocol following bolus administration of intravenous contrast. CONTRAST:  17m OMNIPAQUE IOHEXOL 300 MG/ML  SOLN COMPARISON:  None. FINDINGS: Lower chest: No acute abnormality. Hepatobiliary: A 1.0 cm diameter focus of parenchymal low attenuation is seen within the left lobe of the liver (axial CT image 11, CT series number 2). No gallstones, gallbladder wall thickening, or  biliary dilatation. Pancreas: Unremarkable. No pancreatic ductal dilatation or surrounding inflammatory changes. Spleen: Normal in size without focal abnormality. Adrenals/Urinary Tract: Adrenal glands are unremarkable. Kidneys are normal, without renal calculi, focal lesion, or hydronephrosis. The urinary bladder is contracted and subsequently limited in evaluation. Stomach/Bowel: Stomach is within normal limits. The appendix is retrocecal in location and normal in appearance (axial CT images 57 through 64, CT series number 2/sagittal reformatted images 44 through 59, CT series number 6). There is no evidence of bowel dilatation. Mild to moderate severity thickening of the ascending and proximal transverse colon is seen. Multiple loops of markedly thickened and inflamed distal and terminal ileum are seen within the right lower quadrant and pelvis. Associated peri intestinal inflammatory fat stranding is present. A 1.9 cm x 1.1 cm x 3.1 cm focus of air is seen within the pelvis on the right. This is adjacent to the previously described loops of inflamed distal ileum and cannot definitively be localized within  a loop of bowel (axial CT images 68 through 74, CT series number 2). Vascular/Lymphatic: No significant vascular findings are present. No enlarged abdominal or pelvic lymph nodes. Reproductive: Uterus and bilateral adnexa are unremarkable. Other: No abdominal wall hernia or abnormality. No abdominopelvic ascites. Musculoskeletal: No acute or significant osseous findings. IMPRESSION: 1. Marked severity enteritis involving multiple loops of distal and terminal ileum. 2. Additional findings worrisome for the presence of a focus of contained free air within the pelvis, secondary to perforation of the previously noted inflamed small bowel. 3. Findings likely consistent with a small hepatic cyst versus hemangioma. Electronically Signed   By: Virgina Norfolk M.D.   On: 02/09/2020 20:20      Impression / Plan:    Megan Haynes is a 24 y.o. pleasant African-American female with obesity, with more than 1 year history of abdominal pain, lower abdominal cramps associated with bloating, nonbloody diarrhea, intermittent episodes of nausea and nonbloody, nonbilious emesis, managed as IBS admitted with severe distal and terminal ileitis as well as colitis with microperforation of the ileum.  No evidence of abscess or peritonitis to warrant drainage or laparotomy.  Her presentation with relapsing and remitting symptoms and imaging is highly suggestive of Crohn's disease, fistulizing phenotype involving small and large bowel.  No evidence of perianal Crohn's  Agree with IV antibiotics to cover aerobic and anaerobic organisms for 72 hours, transition to oral antibiotics ciprofloxacin and metronidazole at least for 3 to 4 weeks If patient clinically progresses and repeat CT shows improvement in perforation, will perform colonoscopy to assess severity of the disease before initiating immunosuppressive medication.  She will need combination therapy with an anti-TNF and immunomodulator if heading towards medical management versus surgery as first step in long-term management If patient develops worsening of disease on antibiotics, she will need primary surgical intervention Agree with DVT prophylaxis as patient is at high risk for DVT given proinflammatory state of untreated Crohn's Okay with clear to full liquid diet Recommend to check CRP, fecal calprotectin levels Recommend to check QuantiFERON gold, hepatitis B and C serologies, TPMT levels, iron panel, B12 and folate levels  I have discussed in length regarding natural course of Crohn's disease, disease progression and complications if left untreated, extraintestinal manifestations, importance of avoiding tobacco use, medical management versus surgical intervention, discussed about various Biologics and immunomodulators including risks and benefits  Anticipate,  patient to go home on antibiotics later this week and I will follow-up in my office in 2 weeks   Thank you for involving me in the care of this patient.      LOS: 1 day   Sherri Sear, MD  02/10/2020, 5:30 PM   Note: This dictation was prepared with Dragon dictation along with smaller phrase technology. Any transcriptional errors that result from this process are unintentional.

## 2020-02-11 DIAGNOSIS — D72829 Elevated white blood cell count, unspecified: Secondary | ICD-10-CM

## 2020-02-11 LAB — GASTROINTESTINAL PANEL BY PCR, STOOL (REPLACES STOOL CULTURE)

## 2020-02-11 LAB — PREALBUMIN: Prealbumin: 6.6 mg/dL — ABNORMAL LOW (ref 18–38)

## 2020-02-11 LAB — HEPATITIS C ANTIBODY: HCV Ab: NONREACTIVE

## 2020-02-11 LAB — CULTURE, BLOOD (ROUTINE X 2): Special Requests: ADEQUATE

## 2020-02-11 LAB — C-REACTIVE PROTEIN: CRP: 24.4 mg/dL — ABNORMAL HIGH (ref <1.0)

## 2020-02-11 LAB — HEPATITIS B SURFACE ANTIGEN: Hepatitis B Surface Ag: NONREACTIVE

## 2020-02-11 LAB — HEPATITIS B CORE ANTIBODY, TOTAL: Hep B Core Total Ab: NONREACTIVE

## 2020-02-11 LAB — HEPATITIS A ANTIBODY, TOTAL: hep A Total Ab: NONREACTIVE

## 2020-02-11 LAB — VITAMIN B12: Vitamin B-12: 572 pg/mL (ref 180–914)

## 2020-02-11 MED ORDER — METRONIDAZOLE 500 MG PO TABS
500.0000 mg | ORAL_TABLET | Freq: Three times a day (TID) | ORAL | Status: DC
  Administered 2020-02-11: 500 mg via ORAL
  Filled 2020-02-11 (×2): qty 1

## 2020-02-11 MED ORDER — CIPROFLOXACIN HCL 500 MG PO TABS: 500.0000 mg | ORAL_TABLET | Freq: Two times a day (BID) | ORAL | 0 refills | Status: AC

## 2020-02-11 MED ORDER — METRONIDAZOLE 500 MG PO TABS: 500.0000 mg | ORAL_TABLET | Freq: Three times a day (TID) | ORAL | 0 refills | Status: AC

## 2020-02-11 MED ORDER — CIPROFLOXACIN HCL 500 MG PO TABS
500.0000 mg | ORAL_TABLET | Freq: Two times a day (BID) | ORAL | Status: DC
  Administered 2020-02-11: 500 mg via ORAL
  Filled 2020-02-11: qty 1

## 2020-02-11 NOTE — Progress Notes (Addendum)
[facility] SURGICAL PROGRESS NOTE (cpt [phone])  Hospital Day(s): 2.   Interval History: Patient seen and examined, no acute events or new complaints overnight. Patient reports she is feeling much improved this morning. She denied any abdominal pain, nausea, emesis, fever, chills. Labs are pending this morning. No new imaging. She was seen by Dr Allegra Lai and recommendations reviewed. She is on CLD and tolerating this well. No other complaints   Review of Systems:  Constitutional: denies fever, chills  HEENT: denies cough or congestion  Respiratory: denies any shortness of breath  Cardiovascular: denies chest pain or palpitations  Gastrointestinal: denies abdominal pain, N/V, or diarrhea/and bowel function as per interval history Genitourinary: denies burning with urination or urinary frequency   Vital signs in last 24 hours: [min-max] current  Temp:  [98.5 F (36.9 C)-98.7 F (37.1 C)] 98.5 F (36.9 C) (10/21 0543) Pulse Rate:  [80-87] 84 (10/21 0543) Resp:  [16-20] 20 (10/21 0543) BP: (122-138)/(77-95) 122/78 (10/21 0543) SpO2:  [98 %-100 %] 100 % (10/21 0543)     Height: 5\' 5"  (165.1 cm) Weight: (!) 147.4 kg BMI (Calculated): 54.08   Intake/Output last 2 shifts:  10/20 0701 - 10/21 0700 In: 3012.9 [I.V.:2324.7; IV Piggyback:688.2] Out: -    Physical Exam:  Constitutional: alert, cooperative and no distress  HENT: normocephalic without obvious abnormality  Eyes: PERRL, EOM's grossly intact and symmetric  Respiratory: breathing non-labored at rest  Cardiovascular: regular rate and sinus rhythm  Gastrointestinal: Soft, non-tender, and non-distended, no rebound/guarding Musculoskeletal: UE and LE FROM, no edema or wounds, motor and sensation grossly intact, NT    Labs:  CBC Latest Ref Rng & Units 02/10/2020 02/09/2020  WBC 4.0 - 10.5 K/uL 10.6(H) 14.6(H)  Hemoglobin 12.0 - 15.0 g/dL 02/11/2020) 10.5(L)  Hematocrit 36 - 46 % 28.0(L) 32.9(L)  Platelets 150 - 400 K/uL  283 340   CMP Latest Ref Rng & Units 02/10/2020 02/09/2020  Glucose 70 - 99 mg/dL 92 85  BUN 6 - 20 mg/dL 8 10  Creatinine 02/11/2020 - 1.00 mg/dL 5.00 9.38  Sodium 1.82 - 145 mmol/L 137 135  Potassium 3.5 - 5.1 mmol/L 3.0(L) 3.1(L)  Chloride 98 - 111 mmol/L 101 98  CO2 22 - 32 mmol/L 26 26  Calcium 8.9 - 10.3 mg/dL 993) 7.1(I)  Total Protein 6.5 - 8.1 g/dL 6.6 7.5  Total Bilirubin 0.3 - 1.2 mg/dL 0.7 0.7  Alkaline Phos 38 - 126 U/L 47 50  AST 15 - 41 U/L 11(L) 12(L)  ALT 0 - 44 U/L 11 11     Imaging studies: No new pertinent imaging studies   Assessment/Plan: (ICD-10's: K63.1) 24 y.o. female with resolution in abdominal pain admitted with contained small bowel perforation and inflammation concerning for possible undiagnosed Crohn's disease.   - Okay for full liquid diet this morning  - No emergent surgical interventions  - Appreciate Gi recommendations  - Agree with IV Abx; transition to PO per GI recs  - Pain control prn; antiemetics prn   - Further management per primary service; we will follow   All of the above findings and recommendations were discussed with the patient, and the medical team, and all of patient's questions were answered to her expressed satisfaction.  -- 22, PA-C  [facility] 02/11/2020, 7:35 AM [phone] M-F: 7am - 4pm

## 2020-02-11 NOTE — Progress Notes (Signed)
Patient stated that she has not had a BM in over 24 hours; ED report stated that she had not been able to give stool specimen; C.diff order expired; Cliffton Asters, NP notified of findings via secure chat; c.diff specimen and enteric isolation discontinued. Windy Carina, RN 3:48 AM 02/11/2020

## 2020-02-11 NOTE — Progress Notes (Signed)
Mobility Specialist - Progress Note   02/11/20 1229  Mobility  Activity Ambulated in hall  Level of Assistance Independent  Assistive Device None  Distance Ambulated (ft) 220 ft  Mobility Response Tolerated well  Mobility performed by Mobility specialist  $Mobility charge 1 Mobility    Pre-mobility: 88 HR, 99% SpO2 Post-mobility: 114 HR, 99% SpO2   Pt was sitting EOB with mother present upon arrival. Pt agreed to session. Pt was able to stand and ambulate independently in hallway with no AD. No LOB noted and no c/o SOB. Pt was able to carry out conversation throughout ambulation. Overall, pt tolerated session well. Pt was left EOB with needs in reach.    Filiberto Pinks Mobility Specialist 02/11/20, 12:31 PM

## 2020-02-11 NOTE — Discharge Instructions (Signed)
Full liquid diet

## 2020-02-11 NOTE — Discharge Summary (Signed)
[facility] - [place] at [facility]   PATIENT NAME: Megan Haynes    MR#:  [id number]  DATE OF BIRTH:  11/12/1995  DATE OF ADMISSION:  02/09/2020 ADMITTING PHYSICIAN: Hannah Beat, MD  DATE OF DISCHARGE: 02/11/2020  PRIMARY CARE PHYSICIAN: Patient, No Pcp Per    ADMISSION DIAGNOSIS:  Enteritis [K52.9] Bowel perforation (HCC) [K63.1] Small bowel perforation (HCC) [K63.1] Sepsis, due to unspecified organism, unspecified whether acute organ dysfunction present (HCC) [A41.9]  DISCHARGE DIAGNOSIS:   Severe Enteritis--Suspect Crohn's Sepsis POA--resolved SECONDARY DIAGNOSIS:   Past Medical History:  Diagnosis Date  . Asthma     HOSPITAL COURSE:  [name] a24 y.o.African-American femalewith a known history of asthma, who presented to the emergency room with acute onset of periumbilical and upper abdominal pain for the last 5 to 6 days which has been worsening. She describes her pain as crampy and constant with radiation to her back. It was associated with nausea and vomiting on Monday as well as diarrhea for the last couple of days  1. Acute severe enteritis with possible small bowel contained microperforation Sepsis without organ dysfunction POA)--resolved -Likely Crohn's disease (and has family history of Crohn's disease) -This is manifested by leukocytosis and tachycardia and severe enteritis -recieved IV fluids -patient improved. Tolerating full liquid diet. -Surgery consultation is appreciated. -- Patient seen by Dr. Allegra Lai from [facility] -- recommends antibiotics for four weeks Cipro Flagyl. Patient will be followed as outpatient and further workup for possible Crohn's will be done by Dr. Allegra Lai. -[facility]. stool PCR negative.. No diarrhea so far today. -Blood cultures negative.  2. Asthma, currently controlled. Megan Haynes has not had any flare since childhood.  3. GERD. -on PPI therapy.  4. DVT prophylaxis. -Subcutaneous Lovenox  5. Body  mass index is 54.08 kg/m. morbid obesity -patient advised lifestyle change with diet and exercise  overall improved. Discussed with Dr. Allegra Lai okay to discharge home with outpatient follow-up.   Procedures: none Family communication : none Consults : surgery, G.[facility]. CODE STATUS: full DVT Prophylaxis : Lovenox  Status is: Inpatient   Dispo: The patient is from: Home  Anticipated d/c is to: Home  Anticipated d/c date is: today  Patient currently is  medically stable to d/c. Hemodynamically and clinically stable for discharge. Patient agreeable.  CONSULTS OBTAINED:  Treatment Team:  Leafy Ro, MD [name], MD  DRUG ALLERGIES:  No Known Allergies  DISCHARGE MEDICATIONS:   Allergies as of 02/11/2020   No Known Allergies     Medication List    TAKE these medications   ciprofloxacin 500 MG tablet Commonly known as: CIPRO Take 1 tablet (500 mg total) by mouth 2 (two) times daily for 28 days.   metroNIDAZOLE 500 MG tablet Commonly known as: FLAGYL Take 1 tablet (500 mg total) by mouth every 8 (eight) hours for 28 days.       If you experience worsening of your admission symptoms, develop shortness of breath, life threatening emergency, suicidal or homicidal thoughts you must seek medical attention immediately by calling 911 or calling your MD immediately  if symptoms less severe.  You Must read complete instructions/literature along with all the possible adverse reactions/side effects for all the Medicines you take and that have been prescribed to you. Take any new Medicines after you have completely understood and accept all the possible adverse reactions/side effects.   Please note  You were cared for by a hospitalist during your hospital stay. If you have any questions about your  discharge medications or the care you received while you were in the hospital after you are discharged, you can call the unit and  asked to speak with the hospitalist on call if the hospitalist that took care of you is not available. Once you are discharged, your primary care physician will handle any further medical issues. Please note that NO REFILLS for any discharge medications will be authorized once you are discharged, as it is imperative that you return to your primary care physician (or establish a relationship with a primary care physician if you do not have one) for your aftercare needs so that they can reassess your need for medications and monitor your lab values. Today   SUBJECTIVE   Feels a lot better. No abdominal pain no diarrhea. Tolerating full liquid diet.  VITAL SIGNS:  Blood pressure 110/84, pulse 77, temperature 98.2 F (36.8 C), temperature source Oral, resp. rate 20, height 5\' 5"  (1.651 m), weight (!) 147.4 kg, last menstrual period 01/03/2020, SpO2 100 %.  I/O:    Intake/Output Summary (Last 24 hours) at 02/11/2020 1125 Last data filed at 02/11/2020 1029 Gross per 24 hour  Intake 3132.94 ml  Output --  Net 3132.94 ml    PHYSICAL EXAMINATION:  GENERAL:  24 y.o.-year-old patient lying in the bed with no acute distress. morbid obesity EYES: Pupils equal, round, reactive to light and accommodation. No scleral icterus.  HEENT: Head atraumatic, normocephalic. Oropharynx and nasopharynx clear.  NECK:  Supple, no jugular venous distention. No thyroid enlargement, no tenderness.  LUNGS: Normal breath sounds bilaterally, no wheezing, rales,rhonchi or crepitation. No use of accessory muscles of respiration.  CARDIOVASCULAR: S1, S2 normal. No murmurs, rubs, or gallops.  ABDOMEN: Soft, non-tender, non-distended. Bowel sounds present. No organomegaly or mass. Abdominal obesity EXTREMITIES: No pedal edema, cyanosis, or clubbing.  NEUROLOGIC: Cranial nerves II through XII are intact. Muscle strength 5/5 in all extremities. Sensation intact. Gait not checked.  PSYCHIATRIC: The patient is alert and  oriented x 3.  SKIN: No obvious rash, lesion, or ulcer.   DATA REVIEW:   CBC  Recent Labs  Lab 02/10/20 0506  WBC 10.6*  HGB 9.2*  HCT 28.0*  PLT 283    Chemistries  Recent Labs  Lab 02/10/20 0506  NA 137  K 3.0*  CL 101  CO2 26  GLUCOSE 92  BUN 8  CREATININE 0.71  CALCIUM 8.5*  AST 11*  ALT 11  ALKPHOS 47  BILITOT 0.7    Microbiology Results   Recent Results (from the past 240 hour(s))  Blood culture (routine x 2)     Status: None (Preliminary result)   Collection Time: 02/09/20  8:50 PM   Specimen: BLOOD  Result Value Ref Range Status   Specimen Description BLOOD RIGHT ANTECUBITAL  Final   Special Requests   Final    BOTTLES DRAWN AEROBIC AND ANAEROBIC Blood Culture adequate volume   Culture   Final    NO GROWTH 2 DAYS Performed at [facility], [address], [place], [place]    Report Status PENDING  Incomplete  Blood culture (routine x 2)     Status: None (Preliminary result)   Collection Time: 02/09/20  8:50 PM   Specimen: BLOOD  Result Value Ref Range Status   Specimen Description BLOOD LEFT HAND  Final   Special Requests   Final    BOTTLES DRAWN AEROBIC AND ANAEROBIC Blood Culture adequate volume   Culture   Final    NO  GROWTH 2 DAYS Performed at [facility], [address] [place], [place] [address]    Report Status PENDING  Incomplete  Respiratory Panel by RT PCR (Flu A&B, Covid) - Nasopharyngeal Swab     Status: None   Collection Time: 02/09/20  8:50 PM   Specimen: Nasopharyngeal Swab  Result Value Ref Range Status   SARS Coronavirus 2 by RT PCR NEGATIVE NEGATIVE Final    Comment: (NOTE) SARS-CoV-2 target nucleic acids are NOT DETECTED.  The SARS-CoV-2 RNA is generally detectable in upper respiratoy specimens during the acute phase of infection. The lowest concentration of SARS-CoV-2 viral copies this assay can detect is 131 copies/mL. A negative result does not preclude SARS-Cov-2 infection and should  not be used as the sole basis for treatment or other patient management decisions. A negative result may occur with  improper specimen collection/handling, submission of specimen other than nasopharyngeal swab, presence of viral mutation(s) within the areas targeted by this assay, and inadequate number of viral copies (<131 copies/mL). A negative result must be combined with clinical observations, patient history, and epidemiological information. The expected result is Negative.  Fact Sheet for Patients:  https://www.moore.com/  Fact Sheet for Healthcare Providers:  https://www.young.biz/  This test is no t yet approved or cleared by the [place] FDA and  has been authorized for detection and/or diagnosis of SARS-CoV-2 by FDA under an Emergency Use Authorization (EUA). This EUA will remain  in effect (meaning this test can be used) for the duration of the COVID-19 declaration under Section 564(b)(1) of the Act, 21 U.S.C. section 360bbb-3(b)(1), unless the authorization is terminated or revoked sooner.     Influenza A by PCR NEGATIVE NEGATIVE Final   Influenza B by PCR NEGATIVE NEGATIVE Final    Comment: (NOTE) The Xpert Xpress SARS-CoV-2/FLU/RSV assay is intended as an aid in  the diagnosis of influenza from Nasopharyngeal swab specimens and  should not be used as a sole basis for treatment. Nasal washings and  aspirates are unacceptable for Xpert Xpress SARS-CoV-2/FLU/RSV  testing.  Fact Sheet for Patients: https://www.moore.com/  Fact Sheet for Healthcare Providers: https://www.young.biz/  This test is not yet approved or cleared by the [place] FDA and  has been authorized for detection and/or diagnosis of SARS-CoV-2 by  FDA under an Emergency Use Authorization (EUA). This EUA will remain  in effect (meaning this test can be used) for the duration of the  Covid-19 declaration under  Section 564(b)(1) of the Act, 21  U.S.C. section 360bbb-3(b)(1), unless the authorization is  terminated or revoked. Performed at [facility], [address] [place], [place] [address]   Gastrointestinal Panel by PCR , Stool     Status: None   Collection Time: 02/11/20  5:24 AM   Specimen: Per Rectum; Stool  Result Value Ref Range Status   Campylobacter species NOT DETECTED NOT DETECTED Final   Plesimonas shigelloides NOT DETECTED NOT DETECTED Final   Salmonella species NOT DETECTED NOT DETECTED Final   Yersinia enterocolitica NOT DETECTED NOT DETECTED Final   Vibrio species NOT DETECTED NOT DETECTED Final   Vibrio cholerae NOT DETECTED NOT DETECTED Final   Enteroaggregative E coli (EAEC) NOT DETECTED NOT DETECTED Final   Enteropathogenic E coli (EPEC) NOT DETECTED NOT DETECTED Final   Enterotoxigenic E coli (ETEC) NOT DETECTED NOT DETECTED Final   Shiga like toxin producing E coli (STEC) NOT DETECTED NOT DETECTED Final   Shigella/Enteroinvasive E coli (EIEC) NOT DETECTED NOT DETECTED Final   Cryptosporidium  NOT DETECTED NOT DETECTED Final   Cyclospora cayetanensis NOT DETECTED NOT DETECTED Final   Entamoeba histolytica NOT DETECTED NOT DETECTED Final   Giardia lamblia NOT DETECTED NOT DETECTED Final   Adenovirus F40/41 NOT DETECTED NOT DETECTED Final   Astrovirus NOT DETECTED NOT DETECTED Final   Norovirus GI/GII NOT DETECTED NOT DETECTED Final   Rotavirus A NOT DETECTED NOT DETECTED Final   Sapovirus (I, II, IV, and V) NOT DETECTED NOT DETECTED Final    Comment: Performed at [facility], [address], [place], [place] [address]    RADIOLOGY:  CT ABDOMEN PELVIS W CONTRAST  Result Date: 02/09/2020 CLINICAL DATA:  Mid upper abdominal pain. EXAM: CT ABDOMEN AND PELVIS WITH CONTRAST TECHNIQUE: Multidetector CT imaging of the abdomen and pelvis was performed using the standard protocol following bolus administration of intravenous contrast. CONTRAST:   OMNIPAQUE IOHEXOL 300 MG/ML  SOLN COMPARISON:  None. FINDINGS: Lower chest: No acute abnormality. Hepatobiliary: A 1.0 cm diameter focus of parenchymal low attenuation is seen within the left lobe of the liver (axial CT image 11, CT series number 2). No gallstones, gallbladder wall thickening, or biliary dilatation. Pancreas: Unremarkable. No pancreatic ductal dilatation or surrounding inflammatory changes. Spleen: Normal in size without focal abnormality. Adrenals/Urinary Tract: Adrenal glands are unremarkable. Kidneys are normal, without renal calculi, focal lesion, or hydronephrosis. The urinary bladder is contracted and subsequently limited in evaluation. Stomach/Bowel: Stomach is within normal limits. The appendix is retrocecal in location and normal in appearance (axial CT images 57 through 64, CT series number 2/sagittal reformatted images 44 through 59, CT series number 6). There is no evidence of bowel dilatation. Mild to moderate severity thickening of the ascending and proximal transverse colon is seen. Multiple loops of markedly thickened and inflamed distal and terminal ileum are seen within the right lower quadrant and pelvis. Associated peri intestinal inflammatory fat stranding is present. A 1.9 cm x 1.1 cm x 3.1 cm focus of air is seen within the pelvis on the right. This is adjacent to the previously described loops of inflamed distal ileum and cannot definitively be localized within a loop of bowel (axial CT images 68 through 74, CT series number 2). Vascular/Lymphatic: No significant vascular findings are present. No enlarged abdominal or pelvic lymph nodes. Reproductive: Uterus and bilateral adnexa are unremarkable. Other: No abdominal wall hernia or abnormality. No abdominopelvic ascites. Musculoskeletal: No acute or significant osseous findings. IMPRESSION: 1. Marked severity enteritis involving multiple loops of distal and terminal ileum. 2. Additional findings worrisome for the presence of a  focus of contained free air within the pelvis, secondary to perforation of the previously noted inflamed small bowel. 3. Findings likely consistent with a small hepatic cyst versus hemangioma. Electronically Signed   By: Aram Candela M.D.   On: 02/09/2020 20:20     CODE STATUS:     Code Status Orders  (From admission, onward)         Start     Ordered   02/09/20 2139  Full code  Continuous        02/09/20 2149        Code Status History    This patient has a current code status but no historical code status.   Advance Care Planning Activity       TOTAL TIME TAKING CARE OF THIS PATIENT: *35* minutes.    Enedina Finner M.D  [facility]    CC: Primary care physician; Patient, No Pcp Per

## 2020-02-12 LAB — CULTURE, BLOOD (ROUTINE X 2)

## 2020-02-12 LAB — CALPROTECTIN, FECAL: Calprotectin, Fecal: 1248 ug/g — ABNORMAL HIGH (ref 0–120)

## 2020-02-12 LAB — HEPATITIS B SURFACE ANTIBODY, QUANTITATIVE: Hep B S AB Quant (Post): >1000 m[IU]/mL (ref >9.9)

## 2020-02-13 LAB — QUANTIFERON-TB GOLD PLUS: QuantiFERON-TB Gold Plus: NEGATIVE

## 2020-02-13 LAB — QUANTIFERON-TB GOLD PLUS (RQFGPL)
QuantiFERON Mitogen Value: 3.66 IU/mL
QuantiFERON Nil Value: 0.02 IU/mL
QuantiFERON TB1 Ag Value: 0.01 IU/mL
QuantiFERON TB2 Ag Value: 0.01 IU/mL

## 2020-02-14 LAB — CULTURE, BLOOD (ROUTINE X 2)
Culture: NO GROWTH
Special Requests: ADEQUATE

## 2020-02-15 LAB — H. PYLORI ANTIGEN, STOOL: H. Pylori Stool Ag, Eia: NEGATIVE

## 2020-02-18 LAB — THIOPURINE METHYLTRANSFERASE (TPMT), RBC: TPMT Activity:: 16.1 Units/mL RBC

## 2020-02-19 ENCOUNTER — Ambulatory Visit: Payer: BC Managed Care – PPO | Admitting: Gastroenterology

## 2020-02-19 ENCOUNTER — Encounter: Payer: Self-pay | Admitting: Gastroenterology

## 2020-02-19 ENCOUNTER — Other Ambulatory Visit: Payer: Self-pay

## 2020-02-19 ENCOUNTER — Telehealth: Payer: Self-pay

## 2020-02-19 VITALS — BP 132/85 | HR 130 | Temp 98.3°F | Ht 65.0 in | Wt 310.1 lb

## 2020-02-19 DIAGNOSIS — K50813 Crohn's disease of both small and large intestine with fistula: Secondary | ICD-10-CM

## 2020-02-19 DIAGNOSIS — K632 Fistula of intestine: Secondary | ICD-10-CM | POA: Diagnosis not present

## 2020-02-19 DIAGNOSIS — K50019 Crohn's disease of small intestine with unspecified complications: Secondary | ICD-10-CM

## 2020-02-19 DIAGNOSIS — K50919 Crohn's disease, unspecified, with unspecified complications: Secondary | ICD-10-CM

## 2020-02-19 MED ORDER — NA SULFATE-K SULFATE-MG SULF 17.5-3.13-1.6 GM/177ML PO SOLN: 354.0000 mL | Freq: Once | ORAL | 0 refills | Status: AC

## 2020-02-19 NOTE — Patient Instructions (Addendum)
Your CT Enterography [facility]. Please arrived at 1pm for a 2pm scan. Nothing to eat or drink for 4 hours before the scan.   Please find a Primary care physician before your next appointment with our office. [facility] Customer service manager can help you find one by calling [phone]

## 2020-02-19 NOTE — Progress Notes (Signed)
Arlyss Repress, MD [address]  [address]  [place], [place] [address]  Main: [phone]  Fax: [phone]    Gastroenterology Consultation  Referring Provider:     No ref. provider found Primary Care Physician:  Patient, No Pcp Per Primary Gastroenterologist:  Dr. Arlyss Repress Reason for Consultation:     Probable Crohn's        HPI:   Megan Haynes is a 24 y.o. female is seen as a hospital follow-up for consultation & management of probable small bowel Crohn's.  Patient was [facility] to [facility]/20 secondary to abdominal pain associated with, nausea, vomiting and nonbloody diarrhea.  She underwent CT abdomen and pelvis with contrast which revealed severely inflamed distal and terminal ileal loops with contained perforation as well as inflammation of the right colon.  Surgery has been consulted, patient is evaluated by Dr. Everlene Farrier, there was no emergent or urgent indication for surgery at this time. Patient was treated with IV antibiotics, switched to Cipro and Flagyl upon discharge.  Initial set of labs revealed CRP 24.4, fecal calprotectin levels 1248 as well as leukocytosis, microcytic anemia.  Interval summary Patient reports doing very well since she was discharged.  She is currently taking Cipro and Flagyl twice daily.  She denies any GI symptoms today.   NSAIDs: None  Antiplts/Anticoagulants/Anti thrombotics: None  GI Procedures: None She reports her sister has Crohn's disease  Past Medical History:  Diagnosis Date  . Asthma     History reviewed. No pertinent surgical history.  Current Outpatient Medications:  .  ciprofloxacin (CIPRO) 500 MG tablet, Take 1 tablet (500 mg total) by mouth 2 (two) times daily for 28 days., Disp: 56 tablet, Rfl: 0 .  hyoscyamine (LEVSIN) 0.125 MG tablet, Take by mouth., Disp: , Rfl:  .  metroNIDAZOLE (FLAGYL) 500 MG tablet, Take 1 tablet (500 mg total) by mouth every 8 (eight) hours for 28 days., Disp: 84 tablet, Rfl: 0 .   pantoprazole (PROTONIX) 40 MG tablet, Take 40 mg by mouth 2 (two) times daily., Disp: , Rfl:  .  triamcinolone ointment (KENALOG) 0.1 %, SMARTSIG:1 Application Topical 2-3 Times Daily, Disp: , Rfl:   History reviewed. No pertinent family history.   Social History   Tobacco Use  . Smoking status: Never Smoker  . Smokeless tobacco: Never Used  Substance Use Topics  . Alcohol use: Not Currently  . Drug use: Not Currently    Allergies as of 02/19/2020 - Review Complete 02/19/2020  Allergen Reaction Noted  . Other Anaphylaxis 06/25/2019  . Sulfa antibiotics Anaphylaxis 06/25/2019    Review of Systems:    All systems reviewed and negative except where noted in HPI.   Physical Exam:  BP 132/85 (BP Location: Left Arm, Patient Position: Sitting, Cuff Size: Large)   Pulse (!) 130   Temp 98.3 F (36.8 C) (Oral)   Ht 5\' 5"  (1.651 m)   Wt (!) 310 lb 2 oz (140.7 kg)   BMI 51.61 kg/m  No LMP recorded.  General:   Alert,  Well-developed, well-nourished, pleasant and cooperative in NAD Head:  Normocephalic and atraumatic. Eyes:  Sclera clear, no icterus.   Conjunctiva pink. Ears:  Normal auditory acuity. Nose:  No deformity, discharge, or lesions. Mouth:  No deformity or lesions,oropharynx pink & moist. Neck:  Supple; no masses or thyromegaly. Lungs:  Respirations even and unlabored.  Clear throughout to auscultation.   No wheezes, crackles, or rhonchi. No acute distress. Heart:  Regular  rate and rhythm; no murmurs, clicks, rubs, or gallops. Abdomen:  Normal bowel sounds. Soft, non-tender and non-distended without masses, hepatosplenomegaly or hernias noted.  No guarding or rebound tenderness.   Rectal: Not performed Msk:  Symmetrical without gross deformities. Good, equal movement & strength bilaterally. Pulses:  Normal pulses noted. Extremities:  No clubbing or edema.  No cyanosis. Neurologic:  Alert and oriented x3;  grossly normal neurologically. Skin:  Intact without significant  lesions or rashes. No jaundice. Lymph Nodes:  No significant cervical adenopathy. Psych:  Alert and cooperative. Normal mood and affect.  Imaging Studies: Reviewed  Assessment and Plan:   Megan Haynes is a 24 y.o. African-American female with BMI 51.61, with chronic abdominal symptoms, previously treated as IBS, has elevated CRP, fecal calprotectin levels, microcytic anemia, CT revealed small bowel contained perforation of the terminal ileum, inflammation of the terminal ileum as well as proximal colon concerning for Crohn's disease  Probable Crohn's, fistulizing phenotype involving terminal ileum as well as colon Significantly elevated CRP and fecal calprotectin levels Continue Cipro and Flagyl 500 mg twice daily QuantiFERON gold negative TPMT levels normal Hepatitis B surface antigen, antibody core antibody negative HIV negative, hep C antibody negative No indication for steroids at this time as patient is asymptomatic Recommend CT enterography to follow-up on the perforation, if there is no perforation, recommend colonoscopy with TI evaluation Discussed various management options including medications versus surgery based on the CT as well as colonoscopy findings.  Discussed about anti-TNF's, Entyvio as well as Stelara.  I recommend anti-TNF in her case given aggressive phenotype, recent benefits including risk of infection, lymphoma, skin cancer, infusion reactions discussed.  Information provided.  Patient is agreeable and will start paperwork for Remicade while waiting on the above work-up  Recommend Twinrix vaccine Pap smear up-to-date and negative   Follow up in 2 months   Arlyss Repress, MD

## 2020-02-19 NOTE — Telephone Encounter (Signed)
Patient could not go to [facility] with her insurance. She had to go to [facility]. Called [facility] and patient is scheduled for 03/04/2020 arrive at 12:00pm for a 1:20pm scan. Address is [address]. Called patient and she verbalized understanding

## 2020-02-19 NOTE — Progress Notes (Signed)
su

## 2020-02-26 ENCOUNTER — Ambulatory Visit: Payer: BC Managed Care – PPO

## 2020-02-26 ENCOUNTER — Telehealth: Payer: Self-pay

## 2020-02-26 NOTE — Telephone Encounter (Signed)
Per Delanna Ahmadi with patient regarding starting the infusion. She is still undecided about whether or not she would like to move forward. She will call me on Monday to let me know if she would like to move forward.

## 2020-02-29 ENCOUNTER — Other Ambulatory Visit
Admission: RE | Admit: 2020-02-29 | Discharge: 2020-02-29 | Disposition: A | Discharge status: Home / Self Care | Payer: BC Managed Care – PPO | Attending: Gastroenterology | Admitting: Gastroenterology

## 2020-02-29 ENCOUNTER — Other Ambulatory Visit: Payer: Self-pay

## 2020-02-29 DIAGNOSIS — Z01812 Encounter for preprocedural laboratory examination: Secondary | ICD-10-CM | POA: Insufficient documentation

## 2020-02-29 DIAGNOSIS — Z20822 Contact with and (suspected) exposure to covid-19: Secondary | ICD-10-CM | POA: Insufficient documentation

## 2020-02-29 LAB — COMPREHENSIVE METABOLIC PANEL
ALT: 16 U/L (ref 0–44)
AST: 19 U/L (ref 15–41)
Albumin: 3.7 g/dL (ref 3.5–5.0)
Alkaline Phosphatase: 39 U/L (ref 38–126)
Anion gap: 6 (ref 5–15)
BUN: 8 mg/dL (ref 6–20)
CO2: 26 mmol/L (ref 22–32)
Calcium: 9.2 mg/dL (ref 8.9–10.3)
Chloride: 105 mmol/L (ref 98–111)
Creatinine, Ser: 0.75 mg/dL (ref 0.44–1.00)
GFR, Estimated: >60 mL/min (ref >60)
Glucose, Bld: 99 mg/dL (ref 70–99)
Potassium: 3.4 mmol/L — ABNORMAL LOW (ref 3.5–5.1)
Sodium: 137 mmol/L (ref 135–145)
Total Bilirubin: 0.5 mg/dL (ref 0.3–1.2)
Total Protein: 7.3 g/dL (ref 6.5–8.1)

## 2020-02-29 LAB — CBC
HCT: 34.4 % — ABNORMAL LOW (ref 36.0–46.0)
Hemoglobin: 11.1 g/dL — ABNORMAL LOW (ref 12.0–15.0)
MCH: 23.7 pg — ABNORMAL LOW (ref 26.0–34.0)
MCHC: 32.3 g/dL (ref 30.0–36.0)
MCV: 73.3 fL — ABNORMAL LOW (ref 80.0–100.0)
Platelets: 256 10*3/uL (ref 150–400)
RBC: 4.69 MIL/uL (ref 3.87–5.11)
RDW: 17.8 % — ABNORMAL HIGH (ref 11.5–15.5)
WBC: 6.4 10*3/uL (ref 4.0–10.5)
nRBC: 0 % (ref 0.0–0.2)

## 2020-02-29 LAB — C-REACTIVE PROTEIN: CRP: 0.9 mg/dL (ref <1.0)

## 2020-03-01 ENCOUNTER — Other Ambulatory Visit
Admission: RE | Admit: 2020-03-01 | Discharge: 2020-03-01 | Disposition: A | Discharge status: Home / Self Care | Payer: BC Managed Care – PPO | Source: Ambulatory Visit | Attending: Gastroenterology | Admitting: Gastroenterology

## 2020-03-01 DIAGNOSIS — Z01812 Encounter for preprocedural laboratory examination: Secondary | ICD-10-CM | POA: Diagnosis not present

## 2020-03-01 LAB — SARS CORONAVIRUS 2 (TAT 6-24 HRS): SARS Coronavirus 2: NEGATIVE

## 2020-03-02 ENCOUNTER — Other Ambulatory Visit: Payer: Self-pay

## 2020-03-02 MED ORDER — NA SULFATE-K SULFATE-MG SULF 17.5-3.13-1.6 GM/177ML PO SOLN: 354.0000 mL | Freq: Once | ORAL | 0 refills | Status: AC

## 2020-03-02 NOTE — Progress Notes (Unsigned)
Dr. Allegra Lai wanted patient to have CT scan before colonoscopy. Called patient and asked patient to move colonoscopy to 03/08/2020 but she states she can not. Moved colonoscopy to 03/22/2020 and COVID test 03/18/2020. Patient states she needs another prep sent to pharmacy.

## 2020-03-04 ENCOUNTER — Ambulatory Visit
Admission: RE | Admit: 2020-03-04 | Discharge: 2020-03-04 | Disposition: A | Discharge status: Home / Self Care | Payer: BC Managed Care – PPO | Source: Ambulatory Visit | Attending: Gastroenterology | Admitting: Gastroenterology

## 2020-03-04 DIAGNOSIS — K632 Fistula of intestine: Secondary | ICD-10-CM

## 2020-03-04 DIAGNOSIS — K50019 Crohn's disease of small intestine with unspecified complications: Secondary | ICD-10-CM

## 2020-03-04 MED ORDER — IOPAMIDOL (ISOVUE-300) INJECTION 61%
100.0000 mL | Freq: Once | INTRAVENOUS | Status: AC | PRN
  Administered 2020-03-04: 100 mL via INTRAVENOUS

## 2020-03-09 ENCOUNTER — Telehealth: Payer: Self-pay

## 2020-03-09 NOTE — Telephone Encounter (Signed)
We have to wait until the colonoscopy is performed  RV

## 2020-03-09 NOTE — Telephone Encounter (Signed)
[name] sent a email asking if we have heard if the patient was okay with starting the infusion for her crohn's. I informed [name] she might be waiting on the result of her CT scan that she had Friday and the Colonoscopy that is scheduled for 03/22/2020 Did you want patient to start before the colonoscopy

## 2020-03-09 NOTE — Telephone Encounter (Signed)
Informed [name] of this information

## 2020-03-18 ENCOUNTER — Other Ambulatory Visit
Admission: RE | Admit: 2020-03-18 | Discharge: 2020-03-18 | Disposition: A | Discharge status: Home / Self Care | Payer: BC Managed Care – PPO | Source: Ambulatory Visit | Attending: Gastroenterology | Admitting: Gastroenterology

## 2020-03-18 ENCOUNTER — Other Ambulatory Visit: Payer: Self-pay

## 2020-03-18 DIAGNOSIS — Z01818 Encounter for other preprocedural examination: Secondary | ICD-10-CM | POA: Insufficient documentation

## 2020-03-18 DIAGNOSIS — Z20822 Contact with and (suspected) exposure to covid-19: Secondary | ICD-10-CM | POA: Diagnosis not present

## 2020-03-19 LAB — SARS CORONAVIRUS 2 (TAT 6-24 HRS): SARS Coronavirus 2: NEGATIVE

## 2020-03-21 ENCOUNTER — Other Ambulatory Visit: Payer: Self-pay

## 2020-03-21 MED ORDER — CLENPIQ 10-3.5-12 MG-GM -GM/160ML PO SOLN: 1.0000 | Freq: Once | ORAL | 0 refills | Status: AC

## 2020-03-21 NOTE — Progress Notes (Signed)
Patient states that Suprep is to expensive. Sent in Ringo to the pharmacy tried to call patient but voicemail is not set up

## 2020-03-21 NOTE — Telephone Encounter (Signed)
Patient states the coupon for this prep only brought it down to $90 dollars. Patient wanting to know if there is something cheaper for her colon tomorrow. Please advise.

## 2020-03-22 ENCOUNTER — Telehealth: Payer: Self-pay

## 2020-03-22 ENCOUNTER — Encounter
Admission: RE | Disposition: A | Discharge status: Home / Self Care | Payer: Self-pay | Source: Ambulatory Visit | Attending: Gastroenterology

## 2020-03-22 ENCOUNTER — Other Ambulatory Visit: Payer: Self-pay

## 2020-03-22 ENCOUNTER — Ambulatory Visit
Admission: RE | Admit: 2020-03-22 | Discharge: 2020-03-22 | Disposition: A | Discharge status: Home / Self Care | Payer: BC Managed Care – PPO | Source: Ambulatory Visit | Attending: Gastroenterology | Admitting: Gastroenterology

## 2020-03-22 ENCOUNTER — Ambulatory Visit: Payer: BC Managed Care – PPO | Admitting: Registered Nurse

## 2020-03-22 ENCOUNTER — Encounter: Payer: Self-pay | Admitting: Gastroenterology

## 2020-03-22 DIAGNOSIS — K5000 Crohn's disease of small intestine without complications: Secondary | ICD-10-CM | POA: Diagnosis not present

## 2020-03-22 DIAGNOSIS — Z79899 Other long term (current) drug therapy: Secondary | ICD-10-CM | POA: Insufficient documentation

## 2020-03-22 DIAGNOSIS — K50919 Crohn's disease, unspecified, with unspecified complications: Secondary | ICD-10-CM | POA: Diagnosis not present

## 2020-03-22 HISTORY — PX: COLONOSCOPY WITH PROPOFOL: SHX5780

## 2020-03-22 LAB — POCT PREGNANCY, URINE: Preg Test, Ur: NEGATIVE

## 2020-03-22 SURGERY — COLONOSCOPY WITH PROPOFOL
Anesthesia: General

## 2020-03-22 MED ORDER — PROPOFOL 500 MG/50ML IV EMUL
INTRAVENOUS | Status: DC | PRN
  Administered 2020-03-22: 150 ug/kg/min via INTRAVENOUS

## 2020-03-22 MED ORDER — SODIUM CHLORIDE 0.9 % IV SOLN
INTRAVENOUS | Status: DC
  Administered 2020-03-22: 1000 mL via INTRAVENOUS

## 2020-03-22 MED ORDER — PROPOFOL 10 MG/ML IV BOLUS
INTRAVENOUS | Status: DC | PRN
  Administered 2020-03-22: 100 mg via INTRAVENOUS

## 2020-03-22 MED ORDER — PHENYLEPHRINE HCL (PRESSORS) 10 MG/ML IV SOLN
INTRAVENOUS | Status: DC | PRN
  Administered 2020-03-22: 100 ug via INTRAVENOUS

## 2020-03-22 MED ORDER — LIDOCAINE HCL (CARDIAC) PF 100 MG/5ML IV SOSY
PREFILLED_SYRINGE | INTRAVENOUS | Status: DC | PRN
  Administered 2020-03-22: 40 mg via INTRAVENOUS

## 2020-03-22 NOTE — Op Note (Signed)
[facility] Gastroenterology Patient Name: Viera Okonski Procedure Date: 03/22/2020 10:27 AM MRN: [id number] Account #: [id number] Date of Birth: October 02, 1995 Admit Type: Outpatient Age: 24 Room: [facility] ENDO ROOM 3 Gender: Female Note Status: Finalized Procedure:             Colonoscopy Indications:           Exclusion of Crohn's disease of the small bowel Providers:             [name] MD, MD Medicines:             General Anesthesia Complications:         No immediate complications. Estimated blood loss: None. Procedure:             Pre-Anesthesia Assessment:                        - Prior to the procedure, a History and Physical was                         performed, and patient medications and allergies were                         reviewed. The patient is competent. The risks and                         benefits of the procedure and the sedation options and                         risks were discussed with the patient. All questions                         were answered and informed consent was obtained.                         Patient identification and proposed procedure were                         verified by the physician, the nurse, the                         anesthesiologist, the anesthetist and the technician                         in the pre-procedure area in the procedure room in the                         endoscopy suite. Mental Status Examination: alert and                         oriented. Airway Examination: normal oropharyngeal                         airway and neck mobility. Respiratory Examination:                         clear to auscultation. CV Examination: normal.                         Prophylactic Antibiotics: The patient  does not require                         prophylactic antibiotics. Prior Anticoagulants: The                         patient has taken no previous anticoagulant or                         antiplatelet  agents. ASA Grade Assessment: III - A                         patient with severe systemic disease. After reviewing                         the risks and benefits, the patient was deemed in                         satisfactory condition to undergo the procedure. The                         anesthesia plan was to use general anesthesia.                         Immediately prior to administration of medications,                         the patient was re-assessed for adequacy to receive                         sedatives. The heart rate, respiratory rate, oxygen                         saturations, blood pressure, adequacy of pulmonary                         ventilation, and response to care were monitored                         throughout the procedure. The physical status of the                         patient was re-assessed after the procedure.                        After obtaining informed consent, the colonoscope was                         passed under direct vision. Throughout the procedure,                         the patient's blood pressure, pulse, and oxygen                         saturations were monitored continuously. The                         Colonoscope was introduced through the anus and  advanced to the 15 cm into the ileum. The colonoscopy                         was performed without difficulty. The patient                         tolerated the procedure well. The quality of the bowel                         preparation was evaluated using the BBPS [facility] Bowel                         Preparation Scale) with scores of: Right Colon = 3,                         Transverse Colon = 3 and Left Colon = 3 (entire mucosa                         seen well with no residual staining, small fragments                         of stool or opaque liquid). The total BBPS score                         equals 9. Findings:      The perianal and digital rectal  examinations were normal. Pertinent       negatives include normal sphincter tone and no palpable rectal lesions.      Patchy inflammation, severe and graded as Matts grade 4 (severe       ulceration of the mucosa with hemorrhage) and characterized by altered       vascularity, congestion (edema), friability, aphthous ulcerations and       shallow ulcerations was found in the entire examined ileum and IC valve       with inflammatory stricture. Biopsies were taken with a cold forceps for       histology.      The colon (entire examined portion) appeared normal. Biopsies were taken       with a cold forceps for histology.      The retroflexed view of the distal rectum and anal verge was normal and       showed no anal or rectal abnormalities. Impression:            - Crohn's disease with ileitis. Biopsied.                        - The entire examined colon is normal. Biopsied.                        - The distal rectum and anal verge are normal on                         retroflexion view. Recommendation:        - Discharge patient to home (with escort).                        - Resume previous diet today.                        -  Continue present medications.                        - Await pathology results.                        - Return to my office as previously scheduled.                        - Recommend to initiate anti-TNF therapy Procedure Code(s):     --- Professional ---                        [phone], Colonoscopy, flexible; with biopsy, single or                         multiple Diagnosis Code(s):     --- Professional ---                        K50.00, Crohn's disease of small intestine without                         complications CPT copyright 2019 American Medical Association. All rights reserved. The codes documented in this report are preliminary and upon coder review may  be revised to meet current compliance requirements. Dr. Libby Maw [name] MD,  MD 03/22/2020 11:20:01 AM This report has been signed electronically. Number of Addenda: 0 Note Initiated On: 03/22/2020 10:27 AM Scope Withdrawal Time: 0 hours 13 minutes 52 seconds  Total Procedure Duration: 0 hours 18 minutes 38 seconds  Estimated Blood Loss:  Estimated blood loss: none. Estimated blood loss was                         minimal.      [facility]

## 2020-03-22 NOTE — Telephone Encounter (Signed)
Dr. Allegra Lai stated that patient had colonoscopy today and she was okay now with starting the Entyvio infusion. Emailed Megan Haynes and informed her of this information and asked if there is anything she needed from me to get the patient started

## 2020-03-22 NOTE — Anesthesia Procedure Notes (Signed)
Date/Time: 03/22/2020 10:48 AM Performed by: Stormy Fabian, CRNA Pre-anesthesia Checklist: Patient identified, Emergency Drugs available, Suction available and Patient being monitored Patient Re-evaluated:Patient Re-evaluated prior to induction Oxygen Delivery Method: Nasal cannula Induction Type: IV induction Dental Injury: Teeth and Oropharynx as per pre-operative assessment  Comments: Nasal cannula with etCO2 monitoring

## 2020-03-22 NOTE — Anesthesia Preprocedure Evaluation (Signed)
Anesthesia Evaluation  Patient identified by MRN, date of birth, ID band Patient awake    Reviewed: Allergy & Precautions, H&P , NPO status , Patient's Chart, lab work & pertinent test results, reviewed documented beta blocker date and time   History of Anesthesia Complications Negative for: history of anesthetic complications  Airway Mallampati: IV  TM Distance: >3 FB Neck ROM: full    Dental  (+) Dental Advidsory Given, Teeth Intact, Chipped   Pulmonary neg shortness of breath, asthma , neg sleep apnea, neg COPD, neg recent URI,    Pulmonary exam normal breath sounds clear to auscultation       Cardiovascular Exercise Tolerance: Good negative cardio ROS Normal cardiovascular exam Rhythm:regular Rate:Normal     Neuro/Psych negative neurological ROS  negative psych ROS   GI/Hepatic negative GI ROS, Neg liver ROS,   Endo/Other  neg diabetesMorbid obesity  Renal/GU negative Renal ROS  negative genitourinary   Musculoskeletal   Abdominal   Peds  Hematology negative hematology ROS (+)   Anesthesia Other Findings Past Medical History: No date: Asthma   Reproductive/Obstetrics negative OB ROS                             Anesthesia Physical Anesthesia Plan  ASA: III  Anesthesia Plan: General   Post-op Pain Management:    Induction: Intravenous  PONV Risk Score and Plan: 3 and Propofol infusion and TIVA  Airway Management Planned: Natural Airway and Nasal Cannula  Additional Equipment:   Intra-op Plan:   Post-operative Plan: Extubation in OR  Informed Consent: I have reviewed the patients History and Physical, chart, labs and discussed the procedure including the risks, benefits and alternatives for the proposed anesthesia with the patient or authorized representative who has indicated his/her understanding and acceptance.     Dental Advisory Given  Plan Discussed with:  Anesthesiologist, CRNA and Surgeon  Anesthesia Plan Comments:         Anesthesia Quick Evaluation

## 2020-03-22 NOTE — Anesthesia Postprocedure Evaluation (Signed)
Anesthesia Post Note  Patient: Megan Haynes  Procedure(s) Performed: COLONOSCOPY WITH PROPOFOL (N/A )  Patient location during evaluation: Endoscopy Anesthesia Type: General Level of consciousness: awake and alert Pain management: pain level controlled Vital Signs Assessment: post-procedure vital signs reviewed and stable Respiratory status: spontaneous breathing, nonlabored ventilation, respiratory function stable and patient connected to nasal cannula oxygen Cardiovascular status: blood pressure returned to baseline and stable Postop Assessment: no apparent nausea or vomiting Anesthetic complications: no   No complications documented.   Last Vitals:  Vitals:   03/22/20 1130 03/22/20 1140  BP: 118/71 (!) 105/49  Pulse: 94 74  Resp: 17 12  Temp:    SpO2: 100%     Last Pain:  Vitals:   03/22/20 1120  TempSrc: Temporal  PainSc:                  Lenard Simmer

## 2020-03-22 NOTE — Transfer of Care (Signed)
Immediate Anesthesia Transfer of Care Note  Patient: Megan Haynes  Procedure(s) Performed: Procedure(s): COLONOSCOPY WITH PROPOFOL (N/A)  Patient Location: PACU and Endoscopy Unit  Anesthesia Type:General  Level of Consciousness: sedated  Airway & Oxygen Therapy: Patient Spontanous Breathing and Patient connected to nasal cannula oxygen  Post-op Assessment: Report given to RN and Post -op Vital signs reviewed and stable  Post vital signs: Reviewed and stable  Last Vitals:  Vitals:   03/22/20 1015 03/22/20 1120  BP: (!) 135/97 (!) 106/45  Pulse: 90 97  Resp: 18 18  Temp: (!) 36.3 C (!) 36.3 C  SpO2: 100% 100%    Complications: No apparent anesthesia complications

## 2020-03-22 NOTE — H&P (Signed)
Arlyss Repress, MD [address]  [address]  [place], [place] [address]  Main: [phone]  Fax: [phone] Pager: [phone]  Primary Care Physician:  Cyndia Diver, PA-C Primary Gastroenterologist:  Dr. Arlyss Repress  Pre-Procedure History & Physical: HPI:  Megan Haynes is a 24 y.o. female is here for an colonoscopy.   Past Medical History:  Diagnosis Date  . Asthma     Past Surgical History:  Procedure Laterality Date  . TONSILLECTOMY      Prior to Admission medications   Medication Sig Start Date End Date Taking? Authorizing Provider  hyoscyamine (LEVSIN) 0.125 MG tablet Take by mouth. 02/08/20  Yes [provider]  pantoprazole (PROTONIX) 40 MG tablet Take 40 mg by mouth 2 (two) times daily. 10/28/19  Yes [provider]  triamcinolone ointment (KENALOG) 0.1 % SMARTSIG:1 Application Topical 2-3 Times Daily 12/16/19  Yes [provider]    Allergies as of 02/19/2020 - Review Complete 02/19/2020  Allergen Reaction Noted  . Other Anaphylaxis 06/25/2019  . Sulfa antibiotics Anaphylaxis 06/25/2019    History reviewed. No pertinent family history.  Social History   Socioeconomic History  . Marital status: Single    Spouse name: Not on file  . Number of children: Not on file  . Years of education: Not on file  . Highest education level: Not on file  Occupational History  . Not on file  Tobacco Use  . Smoking status: Never Smoker  . Smokeless tobacco: Never Used  Vaping Use  . Vaping Use: Never used  Substance and Sexual Activity  . Alcohol use: Not Currently  . Drug use: Not Currently  . Sexual activity: Not on file  Other Topics Concern  . Not on file  Social History Narrative  . Not on file   Social Determinants of Health   Financial Resource Strain:   . Difficulty of Paying Living Expenses: Not on file  Food Insecurity:   . Worried About Programme researcher, broadcasting/film/video in the Last Year: Not on file  . Ran Out of Food  in the Last Year: Not on file  Transportation Needs:   . Lack of Transportation (Medical): Not on file  . Lack of Transportation (Non-Medical): Not on file  Physical Activity:   . Days of Exercise per Week: Not on file  . Minutes of Exercise per Session: Not on file  Stress:   . Feeling of Stress : Not on file  Social Connections:   . Frequency of Communication with Friends and Family: Not on file  . Frequency of Social Gatherings with Friends and Family: Not on file  . Attends Religious Services: Not on file  . Active Member of Clubs or Organizations: Not on file  . Attends Banker Meetings: Not on file  . Marital Status: Not on file  Intimate Partner Violence:   . Fear of Current or Ex-Partner: Not on file  . Emotionally Abused: Not on file  . Physically Abused: Not on file  . Sexually Abused: Not on file    Review of Systems: See HPI, otherwise negative ROS  Physical Exam: BP (!) 135/97   Pulse 90   Temp (!) 97.4 F (36.3 C) (Temporal)   Resp 18   Ht 5\' 5"  (1.651 m)   Wt (!) 143.6 kg   LMP 03/20/2020 Comment: pregnancy test negative  SpO2 100%   BMI 52.69 kg/m  General:   Alert,  pleasant and cooperative in NAD Head:  Normocephalic and atraumatic. Neck:  Supple; no masses or thyromegaly. Lungs:  Clear throughout to auscultation.    Heart:  Regular rate and rhythm. Abdomen:  Soft, nontender and nondistended. Normal bowel sounds, without guarding, and without rebound.   Neurologic:  Alert and  oriented x4;  grossly normal neurologically.  Impression/Plan: Megan Haynes is here for an colonoscopy to be performed for terminal ileitis  Risks, benefits, limitations, and alternatives regarding  colonoscopy have been reviewed with the patient.  Questions have been answered.  All parties agreeable.   Lannette Donath, MD  03/22/2020, 10:27 AM

## 2020-03-23 ENCOUNTER — Encounter: Payer: Self-pay | Admitting: Gastroenterology

## 2020-03-23 LAB — SURGICAL PATHOLOGY

## 2020-03-24 NOTE — Telephone Encounter (Signed)
Double checked with Kari patient is getting Remicaide not entyvio  

## 2020-03-24 NOTE — Telephone Encounter (Signed)
-----   Message from [name], MD sent at 03/23/2020  5:43 PM EST ----- Megan Haynes  She needs Remicade, not Entyvio.  Please make sure, [name] is aware.  I remember we did the paperwork for Remicade originally  Megan Haynes [address]

## 2020-03-24 NOTE — Telephone Encounter (Signed)
Double checked with Megan Haynes patient is getting Remicaide not entyvio

## 2020-03-28 ENCOUNTER — Telehealth: Payer: Self-pay

## 2020-03-28 NOTE — Telephone Encounter (Signed)
[name] reached out to me stating that they have been trying to reach out to patient about her remicaide infusion. Tried to call patient but voicemail is not set up. Sent a mychart message to patient

## 2020-04-04 NOTE — Telephone Encounter (Signed)
Tried to call patient but patient voicemail is not set up. Tried to call to inform patient she needed to call the pharmacy about her infusion

## 2020-04-05 ENCOUNTER — Telehealth: Payer: Self-pay

## 2020-04-05 NOTE — Telephone Encounter (Signed)
Tried to call patient but voicemail is not set up  

## 2020-04-05 NOTE — Telephone Encounter (Signed)
Per [name] we will put Megan Haynes on hold.  Let me know if you hear from her and I will as well ??  We can reopen her case when we hear back

## 2021-02-02 DIAGNOSIS — O0993 Supervision of high risk pregnancy, unspecified, third trimester: Secondary | ICD-10-CM | POA: Insufficient documentation

## 2021-02-06 LAB — OB RESULTS CONSOLE VARICELLA ZOSTER ANTIBODY, IGG: Varicella: IMMUNE

## 2021-02-06 LAB — OB RESULTS CONSOLE HEPATITIS B SURFACE ANTIGEN: Hepatitis B Surface Ag: NEGATIVE

## 2021-02-06 LAB — OB RESULTS CONSOLE RUBELLA ANTIBODY, IGM: Rubella: IMMUNE

## 2021-02-27 ENCOUNTER — Other Ambulatory Visit: Admission: RE | Admit: 2021-02-27 | Payer: BC Managed Care – PPO | Source: Ambulatory Visit

## 2021-04-23 NOTE — L&D Delivery Note (Signed)
Delivery Note  Date of delivery: 06/01/2021 Estimated Date of Delivery: 06/07/21 Patient's last menstrual period was 08/31/2020. EGA: [redacted]w[redacted]d  Delivery Note At 11:58 AM a viable female was delivered via Vaginal, Spontaneous (Presentation: LOT).  APGAR: 9, 9; weight 3520g. Placenta status: Spontaneous, Intact.  Cord: 3 vessels with the following complications: None.    First Stage: Labor onset: 0700 Augmentation : AROM and pitocin Analgesia /Anesthesia intrapartum: Epidural AROM at 0537  Megan Haynes presented to L&D for IOL for BMI. She was augmented with pitocin. Epidural placed for pain relief.   Second Stage: Complete dilation at 0900 Onset of pushing at 1044 FHR second stage Cat I Delivery at 1158 on 06/01/2021  She progressed to complete and had a spontaneous vaginal birth of a live female over an intact perineum. The fetal head was delivered in LOT position with no restitution.  No nuchal cord. Anterior then posterior shoulders delivered spontaneously. Baby placed on mom's abdomen and attended to by transition RN. Cord clamped and cut when pulseless by FOB. Cord blood obtained for newborn labs.  Third Stage: Placenta delivered intact with 3VC at 1205 Placenta disposition: routine disposal  Uterine tone firm / bleeding min IV pitocin given for hemorrhage prophylaxis  Anesthesia: Epidural Episiotomy: None Lacerations: 2nd degree;Perineal;Labial Suture Repair: 2.0 3.0 vicryl Est. Blood Loss (mL): 250  Complications: none  Mom to postpartum.  Baby to Couplet care / Skin to Skin.  Newborn: Birth Weight: 3520g  Apgar Scores: 9, 9 Feeding planned: breastfeeding   Cyril Mourning, CNM 06/01/2021 12:59 PM

## 2021-04-28 DIAGNOSIS — D509 Iron deficiency anemia, unspecified: Secondary | ICD-10-CM | POA: Insufficient documentation

## 2021-05-16 LAB — OB RESULTS CONSOLE RPR: RPR: NONREACTIVE

## 2021-05-16 LAB — OB RESULTS CONSOLE GC/CHLAMYDIA
Chlamydia: NEGATIVE
Gonorrhea: NEGATIVE

## 2021-05-16 LAB — OB RESULTS CONSOLE HIV ANTIBODY (ROUTINE TESTING): HIV: NONREACTIVE

## 2021-05-16 LAB — OB RESULTS CONSOLE GBS: GBS: POSITIVE

## 2021-05-18 ENCOUNTER — Encounter
Admission: RE | Admit: 2021-05-18 | Discharge: 2021-05-18 | Disposition: A | Discharge status: Home / Self Care | Payer: BC Managed Care – PPO | Source: Ambulatory Visit | Attending: Anesthesiology | Admitting: Anesthesiology

## 2021-05-18 ENCOUNTER — Other Ambulatory Visit: Payer: Self-pay

## 2021-05-18 NOTE — Consult Note (Signed)
[facility] Anesthesia Consultation  Megan Haynes FVC:[id number] DOB: 01-22-1996 DOA: 05/18/2021 PCP: Cyndia Diver, PA-C   Requesting physician: Feliberto Gottron, MD Date of consultation: 05/18/21 Reason for consultation: Obesity during pregnancy  CHIEF COMPLAINT:  Obesity during pregnancy  HISTORY OF PRESENT ILLNESS: Megan Haynes  is a G4P0 26 y.o. female with a known history of childhood asthma, morbid obesity (BMI 53) presenting for pre anesthetic evaluation. Prior anesthetics without issue. No known pregnancy problems, no known back problems. Denies OSA, cardiovascular, or bleeding disorders.  PAST MEDICAL HISTORY:   Past Medical History:  Diagnosis Date   Asthma     PAST SURGICAL HISTORY:  Past Surgical History:  Procedure Laterality Date   COLONOSCOPY WITH PROPOFOL N/A 03/22/2020   Procedure: COLONOSCOPY WITH PROPOFOL;  Surgeon: [name], MD;  Location: [facility] ENDOSCOPY;  Service: Gastroenterology;  Laterality: N/A;   TONSILLECTOMY      SOCIAL HISTORY:  Social History   Tobacco Use   Smoking status: Never   Smokeless tobacco: Never  Substance Use Topics   Alcohol use: Not Currently    FAMILY HISTORY: No family history on file.  DRUG ALLERGIES:  Allergies  Allergen Reactions   Other Anaphylaxis   Sulfa Antibiotics Anaphylaxis    REVIEW OF SYSTEMS:   RESPIRATORY: No cough, shortness of breath, wheezing.  CARDIOVASCULAR: No chest pain, orthopnea, edema.  HEMATOLOGY: No anemia, easy bruising or bleeding SKIN: No rash or lesion. NEUROLOGIC: No tingling, numbness, weakness.  PSYCHIATRY: No anxiety or depression.   MEDICATIONS AT HOME:  Prior to Admission medications   Medication Sig Start Date End Date Taking? Authorizing Provider  hyoscyamine (LEVSIN) 0.125 MG tablet Take by mouth. 02/08/20   [provider]  pantoprazole (PROTONIX) 40 MG tablet Take 40 mg by mouth 2 (two) times daily. 10/28/19    [provider]  triamcinolone ointment (KENALOG) 0.1 % SMARTSIG:1 Application Topical 2-3 Times Daily 12/16/19   [provider]      PHYSICAL EXAMINATION:   VITAL SIGNS: There were no vitals taken for this visit.  GENERAL:  26 y.o.-year-old patient no acute distress.  HEENT: Head atraumatic, normocephalic. Oropharynx and nasopharynx clear. MP 2, TM distance >3 cm, normal mouth opening. LUNGS: No use of accessory muscles of respiration.   EXTREMITIES: No pedal edema, cyanosis, or clubbing.  NEUROLOGIC: normal gait PSYCHIATRIC: The patient is alert and oriented x 3.  SKIN: No obvious rash, lesion, or ulcer.    IMPRESSION AND PLAN:   Megan Haynes  is a 26 y.o. female presenting with obesity during pregnancy. BMI is currently 53 at [redacted] weeks gestation.   We discussed analgesic options during labor including epidural analgesia. Discussed that in obesity there can be increased difficulty with epidural placement or even failure of successful epidural. We also discussed that even after successful epidural placement there is increased risk of catheter migration out of the epidural space that would require catheter replacement. Discussed use of epidural vs spinal vs GA if cesarean delivery is required. Discussed increased risk of difficult intubation during pregnancy should an emergency cesarean delivery be required.   OK for care at [facility].

## 2021-05-29 ENCOUNTER — Other Ambulatory Visit
Admission: RE | Admit: 2021-05-29 | Discharge: 2021-05-29 | Disposition: A | Discharge status: Home / Self Care | Payer: BC Managed Care – PPO | Source: Ambulatory Visit

## 2021-05-29 ENCOUNTER — Other Ambulatory Visit: Payer: Self-pay

## 2021-05-29 ENCOUNTER — Other Ambulatory Visit: Payer: Self-pay | Admitting: Obstetrics and Gynecology

## 2021-05-29 DIAGNOSIS — Z20822 Contact with and (suspected) exposure to covid-19: Secondary | ICD-10-CM | POA: Insufficient documentation

## 2021-05-29 DIAGNOSIS — Z01812 Encounter for preprocedural laboratory examination: Secondary | ICD-10-CM | POA: Insufficient documentation

## 2021-05-29 NOTE — Progress Notes (Signed)
Dating: EDD: 06/07/21  by LMP: 08/31/20 and c/w [place] at [redacted]w[redacted]d   Preg c/b: Txfer of care from [facility] at 22wks Obesity, BMI 51.9 at transfer Crohns Dz, no meds Iron deficiency anemia, BID supplementation.   Prenatal Labs: Blood type/Rh O pos  Antibody screen neg  Rubella Immune  Varicella Immune  RPR NR  HBsAg Neg  HIV NR  GC neg  Chlamydia neg  Genetic screening negative  1 hour GTT 113  3 hour GTT   GBS Pos    Contraception: TBD Infant feeding: breast Tdap:01/30/21 Flu:01/30/21

## 2021-05-30 LAB — SARS CORONAVIRUS 2 (TAT 6-24 HRS): SARS Coronavirus 2: NEGATIVE

## 2021-05-31 ENCOUNTER — Inpatient Hospital Stay: Payer: BC Managed Care – PPO | Admitting: Anesthesiology

## 2021-05-31 ENCOUNTER — Inpatient Hospital Stay
Admission: EM | Admit: 2021-05-31 | Discharge: 2021-06-02 | DRG: 806 | Disposition: A | Discharge status: Home / Self Care | Payer: BC Managed Care – PPO | Attending: Obstetrics | Admitting: Obstetrics

## 2021-05-31 ENCOUNTER — Other Ambulatory Visit: Payer: Self-pay

## 2021-05-31 DIAGNOSIS — Z6841 Body Mass Index (BMI) 40.0 and over, adult: Secondary | ICD-10-CM

## 2021-05-31 DIAGNOSIS — O99214 Obesity complicating childbirth: Secondary | ICD-10-CM | POA: Diagnosis present

## 2021-05-31 DIAGNOSIS — O9902 Anemia complicating childbirth: Secondary | ICD-10-CM | POA: Diagnosis present

## 2021-05-31 DIAGNOSIS — D509 Iron deficiency anemia, unspecified: Secondary | ICD-10-CM | POA: Diagnosis present

## 2021-05-31 DIAGNOSIS — Z20822 Contact with and (suspected) exposure to covid-19: Secondary | ICD-10-CM | POA: Diagnosis present

## 2021-05-31 DIAGNOSIS — O9962 Diseases of the digestive system complicating childbirth: Secondary | ICD-10-CM | POA: Diagnosis present

## 2021-05-31 DIAGNOSIS — K5090 Crohn's disease, unspecified, without complications: Secondary | ICD-10-CM | POA: Diagnosis present

## 2021-05-31 DIAGNOSIS — O99213 Obesity complicating pregnancy, third trimester: Secondary | ICD-10-CM | POA: Diagnosis present

## 2021-05-31 DIAGNOSIS — O139 Gestational [pregnancy-induced] hypertension without significant proteinuria, unspecified trimester: Secondary | ICD-10-CM | POA: Diagnosis not present

## 2021-05-31 DIAGNOSIS — Z882 Allergy status to sulfonamides status: Secondary | ICD-10-CM

## 2021-05-31 DIAGNOSIS — Z3A39 39 weeks gestation of pregnancy: Secondary | ICD-10-CM

## 2021-05-31 HISTORY — DX: Crohn's disease, unspecified, without complications: K50.90

## 2021-05-31 LAB — CBC
HCT: 33.7 % — ABNORMAL LOW (ref 36.0–46.0)
Hemoglobin: 10.8 g/dL — ABNORMAL LOW (ref 12.0–15.0)
MCH: 24.7 pg — ABNORMAL LOW (ref 26.0–34.0)
MCHC: 32 g/dL (ref 30.0–36.0)
MCV: 76.9 fL — ABNORMAL LOW (ref 80.0–100.0)
Platelets: 220 10*3/uL (ref 150–400)
RBC: 4.38 MIL/uL (ref 3.87–5.11)
RDW: 16.5 % — ABNORMAL HIGH (ref 11.5–15.5)
WBC: 9 10*3/uL (ref 4.0–10.5)
nRBC: 0 % (ref 0.0–0.2)

## 2021-05-31 LAB — COMPREHENSIVE METABOLIC PANEL
ALT: 28 U/L (ref 0–44)
AST: 27 U/L (ref 15–41)
Albumin: 2.6 g/dL — ABNORMAL LOW (ref 3.5–5.0)
Alkaline Phosphatase: 126 U/L (ref 38–126)
Anion gap: 6 (ref 5–15)
BUN: <5 mg/dL — ABNORMAL LOW (ref 6–20)
CO2: 20 mmol/L — ABNORMAL LOW (ref 22–32)
Calcium: 8.9 mg/dL (ref 8.9–10.3)
Chloride: 109 mmol/L (ref 98–111)
Creatinine, Ser: 0.62 mg/dL (ref 0.44–1.00)
GFR, Estimated: >60 mL/min (ref >60)
Glucose, Bld: 110 mg/dL — ABNORMAL HIGH (ref 70–99)
Potassium: 3.2 mmol/L — ABNORMAL LOW (ref 3.5–5.1)
Sodium: 135 mmol/L (ref 135–145)
Total Bilirubin: 0.3 mg/dL (ref 0.3–1.2)
Total Protein: 6.5 g/dL (ref 6.5–8.1)

## 2021-05-31 LAB — TYPE AND SCREEN
ABO/RH(D): O POS
Antibody Screen: NEGATIVE

## 2021-05-31 LAB — ABO/RH: ABO/RH(D): O POS

## 2021-05-31 LAB — PROTEIN / CREATININE RATIO, URINE
Creatinine, Urine: 179 mg/dL
Protein Creatinine Ratio: 0.16 mg/mg{Cre} — ABNORMAL HIGH (ref 0.00–0.15)
Total Protein, Urine: 28 mg/dL

## 2021-05-31 LAB — RPR: RPR Ser Ql: NONREACTIVE

## 2021-05-31 MED ORDER — MISOPROSTOL 25 MCG QUARTER TABLET
25.0000 ug | ORAL_TABLET | ORAL | Status: DC | PRN
  Administered 2021-05-31: 25 ug via BUCCAL
  Filled 2021-05-31 (×2): qty 1

## 2021-05-31 MED ORDER — LIDOCAINE-EPINEPHRINE (PF) 1.5 %-1:200000 IJ SOLN
INTRAMUSCULAR | Status: DC | PRN
  Administered 2021-05-31: 3 mL via EPIDURAL

## 2021-05-31 MED ORDER — OXYTOCIN BOLUS FROM INFUSION
333.0000 mL | Freq: Once | INTRAVENOUS | Status: AC
  Administered 2021-06-01: 333 mL via INTRAVENOUS

## 2021-05-31 MED ORDER — MISOPROSTOL 200 MCG PO TABS
ORAL_TABLET | ORAL | Status: AC
  Filled 2021-05-31: qty 4

## 2021-05-31 MED ORDER — LACTATED RINGERS IV SOLN: 500.0000 mL | INTRAVENOUS | Status: DC | PRN

## 2021-05-31 MED ORDER — DIPHENHYDRAMINE HCL 50 MG/ML IJ SOLN: 12.5000 mg | INTRAMUSCULAR | Status: DC | PRN

## 2021-05-31 MED ORDER — BUTORPHANOL TARTRATE 1 MG/ML IJ SOLN
1.0000 mg | INTRAMUSCULAR | Status: DC | PRN
  Administered 2021-05-31: 1 mg via INTRAVENOUS
  Filled 2021-05-31: qty 1

## 2021-05-31 MED ORDER — ONDANSETRON HCL 4 MG/2ML IJ SOLN
4.0000 mg | Freq: Four times a day (QID) | INTRAMUSCULAR | Status: DC | PRN
  Administered 2021-05-31: 4 mg via INTRAVENOUS
  Filled 2021-05-31: qty 2

## 2021-05-31 MED ORDER — AMMONIA AROMATIC IN INHA
RESPIRATORY_TRACT | Status: AC
  Filled 2021-05-31: qty 10

## 2021-05-31 MED ORDER — ACETAMINOPHEN 325 MG PO TABS: 650.0000 mg | ORAL_TABLET | ORAL | Status: DC | PRN

## 2021-05-31 MED ORDER — EPHEDRINE 5 MG/ML INJ
10.0000 mg | INTRAVENOUS | Status: DC | PRN
  Filled 2021-05-31: qty 2

## 2021-05-31 MED ORDER — LIDOCAINE HCL (PF) 1 % IJ SOLN
INTRAMUSCULAR | Status: AC
  Filled 2021-05-31: qty 30

## 2021-05-31 MED ORDER — PHENYLEPHRINE 40 MCG/ML (10ML) SYRINGE FOR IV PUSH (FOR BLOOD PRESSURE SUPPORT)
80.0000 ug | PREFILLED_SYRINGE | INTRAVENOUS | Status: DC | PRN
  Filled 2021-05-31: qty 10

## 2021-05-31 MED ORDER — EPHEDRINE 5 MG/ML INJ
10.0000 mg | INTRAVENOUS | Status: AC | PRN
  Administered 2021-06-01 (×2): 10 mg via INTRAVENOUS
  Filled 2021-05-31 (×3): qty 4

## 2021-05-31 MED ORDER — MISOPROSTOL 25 MCG QUARTER TABLET
25.0000 ug | ORAL_TABLET | ORAL | Status: DC | PRN
  Administered 2021-05-31 (×2): 25 ug via VAGINAL
  Filled 2021-05-31 (×3): qty 1

## 2021-05-31 MED ORDER — TERBUTALINE SULFATE 1 MG/ML IJ SOLN: 0.2500 mg | Freq: Once | INTRAMUSCULAR | Status: DC | PRN

## 2021-05-31 MED ORDER — LACTATED RINGERS IV SOLN: INTRAVENOUS | Status: DC

## 2021-05-31 MED ORDER — OXYTOCIN-SODIUM CHLORIDE 30-0.9 UT/500ML-% IV SOLN: 2.5000 [IU]/h | INTRAVENOUS | Status: DC

## 2021-05-31 MED ORDER — SOD CITRATE-CITRIC ACID 500-334 MG/5ML PO SOLN: 30.0000 mL | ORAL | Status: DC | PRN

## 2021-05-31 MED ORDER — LACTATED RINGERS IV SOLN
500.0000 mL | Freq: Once | INTRAVENOUS | Status: AC
  Administered 2021-05-31: 500 mL via INTRAVENOUS

## 2021-05-31 MED ORDER — BUPIVACAINE HCL (PF) 0.25 % IJ SOLN
INTRAMUSCULAR | Status: DC | PRN
  Administered 2021-05-31: 8 mL via EPIDURAL

## 2021-05-31 MED ORDER — OXYTOCIN 10 UNIT/ML IJ SOLN
INTRAMUSCULAR | Status: AC
  Filled 2021-05-31: qty 2

## 2021-05-31 MED ORDER — OXYTOCIN-SODIUM CHLORIDE 30-0.9 UT/500ML-% IV SOLN
1.0000 m[IU]/min | INTRAVENOUS | Status: DC
  Administered 2021-05-31: 2 m[IU]/min via INTRAVENOUS
  Administered 2021-06-01: 4 m[IU]/min via INTRAVENOUS
  Administered 2021-06-01: 2 m[IU]/min via INTRAVENOUS
  Filled 2021-05-31: qty 500

## 2021-05-31 MED ORDER — SODIUM CHLORIDE 0.9 % IV SOLN
5.0000 10*6.[IU] | Freq: Once | INTRAVENOUS | Status: AC
  Administered 2021-05-31: 5 10*6.[IU] via INTRAVENOUS
  Filled 2021-05-31: qty 5

## 2021-05-31 MED ORDER — LIDOCAINE HCL (PF) 1 % IJ SOLN
30.0000 mL | INTRAMUSCULAR | Status: DC | PRN
  Filled 2021-05-31: qty 30

## 2021-05-31 MED ORDER — FENTANYL-BUPIVACAINE-NACL 0.5-0.125-0.9 MG/250ML-% EP SOLN
12.0000 mL/h | EPIDURAL | Status: DC | PRN
  Administered 2021-05-31: 12 mL/h via EPIDURAL
  Filled 2021-05-31: qty 250

## 2021-05-31 MED ORDER — PENICILLIN G POT IN DEXTROSE 60000 UNIT/ML IV SOLN
3.0000 10*6.[IU] | INTRAVENOUS | Status: DC
  Administered 2021-05-31 – 2021-06-01 (×3): 3 10*6.[IU] via INTRAVENOUS
  Filled 2021-05-31 (×5): qty 50

## 2021-05-31 NOTE — Progress Notes (Signed)
Labor Progress Note  Megan Haynes is a 26 y.o. G1P0 at [redacted]w[redacted]d by LMP admitted for induction of labor due to obesity.  Subjective: feeling more cramping and worsening contractions.  Spouse and mother at bedside.  Coping well with contractions.   Objective: BP 135/86    Pulse 87    Temp 98.4 F (36.9 C) (Oral)    Resp 20    Ht 5\' 5"  (1.651 m)    Wt (!) 143.3 kg    LMP 08/31/2020    BMI 52.59 kg/m  Notable VS details: Mild range b/p's x 2, repeats WNL  Fetal Assessment: FHT:  FHR: 140 bpm, variability: moderate,  accelerations:  Present,  decelerations:  Absent Category/reactivity:  Category I UC:   regular, every 2-4 minutes SVE:   1-2/60/-2/soft/mid Membrane status: Intact Amniotic color: N/A  Labs: Lab Results  Component Value Date   WBC 9.0 05/31/2021   HGB 10.8 (L) 05/31/2021   HCT 33.7 (L) 05/31/2021   MCV 76.9 (L) 05/31/2021   PLT 220 05/31/2021    Assessment / Plan: Induction of labor d/t obesity -s/p 2 doses of vaginal misoprostol and 1 dose of buccal misoprostol  -reports that contractions feel like they are getting more intense  -Will start oxytocin for continued active management of IOL  Labor:  Cervical ripening with misoprostol -> will start oxytocin  Preeclampsia:   PreE labs WNL, mild range b/p's noted x 2 with normal repeat blood pressures.  Continue to monitor BP's per protocol Fetal Wellbeing:  Category I Pain Control:  Labor support without medications I/D:   GBS positive -> will start IP prophylaxis  Anticipated MOD:  NSVD  Megan Haynes, CNM 05/31/2021, 5:21 PM

## 2021-05-31 NOTE — Progress Notes (Signed)
Labor Progress Note  Megan Haynes is a 26 y.o. G1P0 at [redacted]w[redacted]d by LMP admitted for induction of labor due to obesity.  Subjective: feeling more uncomfortable, having new onset nausea and vomiting, requesting pain medication   Objective: BP (!) 144/93 (BP Location: Right Wrist)    Pulse 81    Temp 98 F (36.7 C) (Oral)    Resp 18    Ht 5\' 5"  (1.651 m)    Wt (!) 143.3 kg    LMP 08/31/2020    BMI 52.59 kg/m  Notable VS details: reviewed - several mild range b/p's noted   Fetal Assessment: FHT:  FHR: 140 bpm, variability: moderate,  accelerations:  Present,  decelerations:  Absent Category/reactivity:  Category I UC:   regular, every 1-4 minutes SVE:   2/70/-3 Membrane status: Intact Amniotic color: N/A  Labs: Lab Results  Component Value Date   WBC 9.0 05/31/2021   HGB 10.8 (L) 05/31/2021   HCT 33.7 (L) 05/31/2021   MCV 76.9 (L) 05/31/2021   PLT 220 05/31/2021    Assessment / Plan: Induction of labor due to obesity,  progressing well on pitocin  Labor: Progressing normally Preeclampsia:  Meets criteria for gestational hypertension, no signs or symptoms of toxicity and labs stable Fetal Wellbeing:  Category I Pain Control:  IV pain meds I/D:   GBS pos - PCN x 1 dose Anticipated MOD:  NSVD  [name], CNM 05/31/2021, 8:22 PM

## 2021-05-31 NOTE — Progress Notes (Signed)
Patient ID: Megan Haynes, female   DOB: 1995/06/02, 26 y.o.   MRN: [id number] Chart reviewed

## 2021-05-31 NOTE — Progress Notes (Signed)
Labor Progress Note  Megan Haynes is a 26 y.o. G1P0 at [redacted]w[redacted]d by LMP admitted for induction of labor due to obesity.  Subjective: feeling tightening and slight and some pelvic pressure, denies cramping   Objective: BP 124/87 (BP Location: Right Wrist)    Pulse 80    Temp 98.8 F (37.1 C) (Axillary)    Resp 16    Ht 5\' 5"  (1.651 m)    Wt (!) 143.3 kg    LMP 08/31/2020    BMI 52.59 kg/m  Notable VS details: reviewed  Fetal Assessment: FHT:  FHR: 140 bpm, variability: moderate,  accelerations:  Present,  decelerations:  Absent Category/reactivity:  Category I UC:   1-5 min, mild to palpation  SVE:   FT/Thick/high by RN Membrane status: Intact  Amniotic color: N/A   Labs: Lab Results  Component Value Date   WBC 9.0 05/31/2021   HGB 10.8 (L) 05/31/2021   HCT 33.7 (L) 05/31/2021   MCV 76.9 (L) 05/31/2021   PLT 220 05/31/2021    Assessment / Plan: IOL d/t obesity  -s/p 1 dose of vaginal and buccal misoprostol -Will give 25mcg vaginal misoprostol now  -ambulating well in room   Labor:  cervical ripening with misoprostol  Fetal Wellbeing:  Category I Pain Control:  Labor support without medications I/D:  n/a Anticipated MOD:  NSVD  [name], CNM 05/31/2021, 12:19 PM

## 2021-05-31 NOTE — Anesthesia Preprocedure Evaluation (Addendum)
Anesthesia Evaluation  Patient identified by MRN, date of birth, ID band Patient awake    Reviewed: Allergy & Precautions, NPO status , Patient's Chart, lab work & pertinent test results  History of Anesthesia Complications Negative for: history of anesthetic complications  Airway Mallampati: IV   Neck ROM: Full    Dental   Molar cracked:   Pulmonary asthma ,    Pulmonary exam normal breath sounds clear to auscultation       Cardiovascular Exercise Tolerance: Good negative cardio ROS Normal cardiovascular exam Rhythm:Regular Rate:Normal     Neuro/Psych negative neurological ROS     GI/Hepatic Crohn's disease   Endo/Other  Class 3 obesity  Renal/GU negative Renal ROS     Musculoskeletal   Abdominal   Peds  Hematology  (+) Blood dyscrasia, anemia ,   Anesthesia Other Findings   Reproductive/Obstetrics                           Anesthesia Physical Anesthesia Plan  ASA: 3  Anesthesia Plan: Epidural   Post-op Pain Management:    Induction:   PONV Risk Score and Plan: 2 and Treatment may vary due to age or medical condition  Airway Management Planned: Natural Airway  Additional Equipment:   Intra-op Plan:   Post-operative Plan:   Informed Consent: I have reviewed the patients History and Physical, chart, labs and discussed the procedure including the risks, benefits and alternatives for the proposed anesthesia with the patient or authorized representative who has indicated his/her understanding and acceptance.     Dental Advisory Given  Plan Discussed with:   Anesthesia Plan Comments: (Patient reports no bleeding problems and no anticoagulant use.   Patient consented for risks of anesthesia including but not limited to:  - adverse reactions to medications - risk of bleeding, infection and or nerve damage from epidural that could lead to paralysis - risk of headache or  failed epidural - nerve damage due to positioning - that if epidural is used for C-section that there is a chance of epidural failure requiring spinal placement or conversion to GA - Damage to heart, brain, lungs, other parts of body or loss of life  Patient voiced understanding.)        Anesthesia Quick Evaluation

## 2021-05-31 NOTE — Progress Notes (Signed)
Labor Progress Note  Megan Haynes is a 26 y.o. G1P0 at [redacted]w[redacted]d by LMP admitted for induction of labor due to obesity, BMI > 50.  Subjective: doing will, feeling mild abdominal tightening   Objective: BP 124/87 (BP Location: Right Wrist)    Pulse 80    Temp 98.8 F (37.1 C) (Axillary)    Resp 16    Ht 5\' 5"  (1.651 m)    Wt (!) 143.3 kg    LMP 08/31/2020    BMI 52.59 kg/m  Notable VS details: reviewed   Fetal Assessment: FHT:  FHR: 140 bpm, variability: moderate,  accelerations:  Present,  decelerations:  Absent Category/reactivity:  Category I UC:   regular, every 2-4 minutes SVE:   deferred  Membrane status: Intact Amniotic color: N/A  Labs: Lab Results  Component Value Date   WBC 9.0 05/31/2021   HGB 10.8 (L) 05/31/2021   HCT 33.7 (L) 05/31/2021   MCV 76.9 (L) 05/31/2021   PLT 220 05/31/2021    Assessment / Plan: Induction of labor d/t obesity with BMI > 50 -s/p one dose of misoprostol  Labor:  Cervical ripening with misoprostol  Fetal Wellbeing:  Category I Pain Control:  Labor support without medications I/D:  GBS positive - will start IP prophylaxis with ROM, regular cervical change, or initiation of oxytocin  Anticipated MOD:  NSVD  [name], CNM 05/31/2021, 8:48 AM

## 2021-05-31 NOTE — H&P (Signed)
OB History & Physical   History of Present Illness:  Chief Complaint:   HPI:  Megan Haynes is a 26 y.o. G1P0 female at [redacted]w[redacted]d dated by LMP.  She presents to L&D for IOL for BMI >50.  Active FM Denies contractions    Pregnancy Issues: Txfer of care from [facility] at 22wks Obesity, BMI 51.9 at transfer Crohns Dz, no meds Iron deficiency anemia, BID supplementation.    Maternal Medical History:   Past Medical History:  Diagnosis Date   Asthma    Crohn's disease ([place])     Past Surgical History:  Procedure Laterality Date   COLONOSCOPY WITH PROPOFOL N/A 03/22/2020   Procedure: COLONOSCOPY WITH PROPOFOL;  Surgeon: Lin Landsman, MD;  Location: [facility] ENDOSCOPY;  Service: Gastroenterology;  Laterality: N/A;   TONSILLECTOMY      Allergies  Allergen Reactions   Other Anaphylaxis   Sulfa Antibiotics Anaphylaxis    Pt states this is incorrect    Prior to Admission medications   Medication Sig Start Date End Date Taking? Authorizing Provider  Prenatal Vit-Fe Fumarate-FA (MULTIVITAMIN-PRENATAL) 27-0.8 MG TABS tablet Take 1 tablet by mouth daily at 12 noon.   Yes [provider]  hyoscyamine (LEVSIN) 0.125 MG tablet Take by mouth. Patient not taking: Reported on 05/31/2021 02/08/20   [provider]  pantoprazole (PROTONIX) 40 MG tablet Take 40 mg by mouth 2 (two) times daily. Patient not taking: Reported on 05/31/2021 10/28/19   [provider]  triamcinolone ointment (KENALOG) 0.1 % SMARTSIG:1 Application Topical 2-3 Times Daily Patient not taking: Reported on 05/31/2021 12/16/19   [provider]     Prenatal care site: [place] History: She  reports that she has never smoked. She has never used smokeless tobacco. She reports that she does not drink alcohol and does not use drugs.  Family History: family history is not on file.   Review of Systems: A full review of systems was performed and negative except as  noted in the HPI.     Physical Exam:  Vital Signs: BP 131/68 (BP Location: Left Wrist)    Pulse (!) 112    Temp 98.3 F (36.8 C) (Oral)    Resp 18    Ht 5\' 5"  (1.651 m)    Wt (!) 143.3 kg    LMP 08/31/2020    BMI 52.59 kg/m  General: no acute distress.  HEENT: normocephalic, atraumatic Heart: regular rate & rhythm.  No murmurs/rubs/gallops Lungs: clear to auscultation bilaterally, normal respiratory effort Abdomen: soft, gravid, non-tender;  EFW: 8lb Pelvic:   External: Normal external female genitalia  Cervix: Dilation: Fingertip / Effacement (%): Thick / Station: -3    Extremities: non-tender, symmetric, minimal edema bilaterally.  DTRs: +2  Neurologic: Alert & oriented x 3.    Results for orders placed or performed during the hospital encounter of 05/31/21 (from the past 24 hour(s))  CBC     Status: Abnormal   Collection Time: 05/31/21  5:16 AM  Result Value Ref Range   WBC 9.0 4.0 - 10.5 K/uL   RBC 4.38 3.87 - 5.11 MIL/uL   Hemoglobin 10.8 (L) 12.0 - 15.0 g/dL   HCT 33.7 (L) 36.0 - 46.0 %   MCV 76.9 (L) 80.0 - 100.0 fL   MCH 24.7 (L) 26.0 - 34.0 pg   MCHC 32.0 30.0 - 36.0 g/dL   RDW 16.5 (H) 11.5 - 15.5 %   Platelets 220 150 - 400 K/uL  nRBC 0.0 0.0 - 0.2 %  Protein / creatinine ratio, urine     Status: Abnormal   Collection Time: 05/31/21  5:16 AM  Result Value Ref Range   Creatinine, Urine 179 mg/dL   Total Protein, Urine 28 mg/dL   Protein Creatinine Ratio 0.16 (H) 0.00 - 0.15 mg/mg[Cre]  Comprehensive metabolic panel     Status: Abnormal   Collection Time: 05/31/21  5:16 AM  Result Value Ref Range   Sodium 135 135 - 145 mmol/L   Potassium 3.2 (L) 3.5 - 5.1 mmol/L   Chloride 109 98 - 111 mmol/L   CO2 20 (L) 22 - 32 mmol/L   Glucose, Bld 110 (H) 70 - 99 mg/dL   BUN <5 (L) 6 - 20 mg/dL   Creatinine, Ser 0.62 0.44 - 1.00 mg/dL   Calcium 8.9 8.9 - 10.3 mg/dL   Total Protein 6.5 6.5 - 8.1 g/dL   Albumin 2.6 (L) 3.5 - 5.0 g/dL   AST 27 15 - 41 U/L   ALT 28 0 -  44 U/L   Alkaline Phosphatase 126 38 - 126 U/L   Total Bilirubin 0.3 0.3 - 1.2 mg/dL   GFR, Estimated >60 >60 mL/min   Anion gap 6 5 - 15    Pertinent Results:  Prenatal Labs: Blood type/Rh O pos  Antibody screen neg  Rubella Immune  Varicella Immune  RPR NR  HBsAg Neg  HIV NR  GC neg  Chlamydia neg  Genetic screening negative  1 hour GTT 113  3 hour GTT N/a  GBS Pos   FHT: 140bpm baseline, moderate variability, accels present, no decelerations TOCO: rare contractions SVE:  Dilation: Fingertip / Effacement (%): Thick / Station: -3    Cephalic by leopolds  No results found.  Assessment:  Megan Haynes is a 26 y.o. G1P0 female at [redacted]w[redacted]d with IOL for BMI >50.   Plan:  1. Admit to Labor & Delivery; consents reviewed and obtained  2. Fetal Well being  - Fetal Tracing: Cat I - Group B Streptococcus ppx indicated: Yes, PCN every 4hrs - Presentation: vertex confirmed by Leopolds   3. Routine OB: - Prenatal labs reviewed, as above - Rh pos - CBC, T&S, RPR on admit - Clear fluids, IVF  4. Induction of Labor -  Contractions rare, external toco in place -  Plan for induction with Cytotec, then Pitocin -  Plan for continuous fetal monitoring  -  Maternal pain control as desired - Anticipate vaginal delivery  5. Post Partum Planning: - Infant feeding: breast - Contraception: TBD  Gertie Fey, CNM 05/31/21 6:36 AM

## 2021-05-31 NOTE — Anesthesia Procedure Notes (Signed)
Epidural Patient location during procedure: OB Start time: 05/31/2021 11:21 PM End time: 05/31/2021 11:28 PM  Staffing Anesthesiologist: Reed Breech, MD Performed: anesthesiologist   Preanesthetic Checklist Completed: patient identified, IV checked, risks and benefits discussed, surgical consent, monitors and equipment checked, pre-op evaluation and timeout performed  Epidural Patient position: sitting Prep: Betadine Patient monitoring: heart rate, continuous pulse ox and blood pressure Approach: midline Location: L3-L4 Injection technique: LOR air  Needle:  Needle type: Tuohy  Needle gauge: 17 G Needle length: 9 cm Needle insertion depth: 9 cm Catheter at skin depth: 15 cm Test dose: negative  Assessment Sensory level: T4  Additional Notes Reason for block:procedure for pain

## 2021-05-31 NOTE — Progress Notes (Signed)
Patient was sitting up straight eating breakfast from 0833-0926am, removed monitors for patient to eat.

## 2021-06-01 ENCOUNTER — Encounter: Payer: Self-pay | Admitting: Obstetrics and Gynecology

## 2021-06-01 MED ORDER — COCONUT OIL OIL
1.0000 "application " | TOPICAL_OIL | Status: DC | PRN
  Filled 2021-06-01: qty 120

## 2021-06-01 MED ORDER — PRENATAL MULTIVITAMIN CH
1.0000 | ORAL_TABLET | Freq: Every day | ORAL | Status: DC
  Filled 2021-06-01: qty 1

## 2021-06-01 MED ORDER — OXYCODONE HCL 5 MG PO TABS: 5.0000 mg | ORAL_TABLET | ORAL | Status: DC | PRN

## 2021-06-01 MED ORDER — ACETAMINOPHEN 325 MG PO TABS: 650.0000 mg | ORAL_TABLET | ORAL | Status: DC | PRN

## 2021-06-01 MED ORDER — TETANUS-DIPHTH-ACELL PERTUSSIS 5-2.5-18.5 LF-MCG/0.5 IM SUSY
0.5000 mL | PREFILLED_SYRINGE | Freq: Once | INTRAMUSCULAR | Status: DC
  Filled 2021-06-01: qty 0.5

## 2021-06-01 MED ORDER — WITCH HAZEL-GLYCERIN EX PADS
1.0000 "application " | MEDICATED_PAD | CUTANEOUS | Status: DC | PRN
  Filled 2021-06-01: qty 100

## 2021-06-01 MED ORDER — IBUPROFEN 600 MG PO TABS
600.0000 mg | ORAL_TABLET | Freq: Four times a day (QID) | ORAL | Status: DC
  Administered 2021-06-01 – 2021-06-02 (×3): 600 mg via ORAL
  Filled 2021-06-01 (×4): qty 1

## 2021-06-01 MED ORDER — SIMETHICONE 80 MG PO CHEW: 80.0000 mg | CHEWABLE_TABLET | ORAL | Status: DC | PRN

## 2021-06-01 MED ORDER — LACTATED RINGERS AMNIOINFUSION
INTRAVENOUS | Status: DC
Start: 2021-06-01 — End: 2021-06-01

## 2021-06-01 MED ORDER — ONDANSETRON HCL 4 MG PO TABS: 4.0000 mg | ORAL_TABLET | ORAL | Status: DC | PRN

## 2021-06-01 MED ORDER — OXYCODONE HCL 5 MG PO TABS: 10.0000 mg | ORAL_TABLET | ORAL | Status: DC | PRN

## 2021-06-01 MED ORDER — DOCUSATE SODIUM 100 MG PO CAPS
100.0000 mg | ORAL_CAPSULE | Freq: Two times a day (BID) | ORAL | Status: DC
  Filled 2021-06-01: qty 1

## 2021-06-01 MED ORDER — FERROUS SULFATE 325 (65 FE) MG PO TABS
325.0000 mg | ORAL_TABLET | Freq: Two times a day (BID) | ORAL | Status: DC
  Administered 2021-06-01 – 2021-06-02 (×2): 325 mg via ORAL
  Filled 2021-06-01 (×2): qty 1

## 2021-06-01 MED ORDER — DIPHENHYDRAMINE HCL 25 MG PO CAPS: 25.0000 mg | ORAL_CAPSULE | Freq: Four times a day (QID) | ORAL | Status: DC | PRN

## 2021-06-01 MED ORDER — ONDANSETRON HCL 4 MG/2ML IJ SOLN: 4.0000 mg | INTRAMUSCULAR | Status: DC | PRN

## 2021-06-01 MED ORDER — DIBUCAINE (PERIANAL) 1 % EX OINT
1.0000 "application " | TOPICAL_OINTMENT | CUTANEOUS | Status: DC | PRN
  Filled 2021-06-01: qty 28

## 2021-06-01 MED ORDER — BENZOCAINE-MENTHOL 20-0.5 % EX AERO
1.0000 "application " | INHALATION_SPRAY | CUTANEOUS | Status: DC | PRN
  Filled 2021-06-01: qty 56

## 2021-06-01 NOTE — Lactation Note (Signed)
This note was copied from a baby's chart. Lactation Consultation Note  Patient Name: Megan Haynes IRWER'X Date: 06/01/2021 Reason for consult: L&D Initial assessment;Primapara;Term;Other (Comment) (IOL) Age:27 hours  P1, SVD desires exclusive breastfeeding.  Maternal Data Has patient been taught Hand Expression?: Yes Does the patient have breastfeeding experience prior to this delivery?: No (did pre-pump prior to delivery) Mom states she has used personal EBP at home prior to induction and has approximately 2oz of colostrum stored.  Feeding Mother's Current Feeding Choice: Breast Milk  LATCH Score Latch: Repeated attempts needed to sustain latch, nipple held in mouth throughout feeding, stimulation needed to elicit sucking reflex.  Audible Swallowing: A few with stimulation  Type of Nipple: Everted at rest and after stimulation  Comfort (Breast/Nipple): Soft / non-tender  Hold (Positioning): Assistance needed to correctly position infant at breast and maintain latch.  LATCH Score: 7  Baby did initially come on/off breast and nipples appeared flat but did evert with stimulation. Baby did sustain a latch and established a rhythmic sucking pattern with swallows identified.  Lactation Tools Discussed/Used    Interventions Interventions: Breast feeding basics reviewed;Assisted with latch;Skin to skin;Hand express;Breast compression;Adjust position;Support pillows;Position options;Education  LC positioned baby in football hold, held baby at the breast. Basic education given: newborn stomach size, feeding frequency, early cues, on demand feedings, and output expectations.  Discharge    Consult Status Consult Status: Follow-up from L&D    Danford Bad 06/01/2021, 1:46 PM

## 2021-06-01 NOTE — Progress Notes (Signed)
Epic down time occurred from 0130 to 0615.  Written progress notes done and in chart for scanning.   Margaretmary Eddy, CNM

## 2021-06-01 NOTE — Discharge Summary (Signed)
Obstetrical Discharge Summary  Patient Name: Megan Haynes DOB: 1995/04/24 MRN: [id number]  Date of Admission: 05/31/2021 Date of Delivery: 06/01/21 Delivered by: Birdie Riddle Date of Discharge: 06/02/2021  Primary OB: [place]  [id number] last menstrual period was 08/31/2020. EDC Estimated Date of Delivery: 06/07/21 Gestational Age at Delivery: [redacted]w[redacted]d   Antepartum complications:  Txfer of care from [facility] at 22wks Obesity, BMI 51.9 at transfer Crohns Dz, no meds Iron deficiency anemia, BID supplementation.  Admitting Diagnosis: IOL for BMI >50  Secondary Diagnosis: Patient Active Problem List   Diagnosis Date Noted   Obesity affecting pregnancy in third trimester 05/31/2021   Gestational hypertension 05/31/2021   Maternal iron deficiency anemia complicating pregnancy in third trimester 04/28/2021   Supervision of high risk pregnancy in third trimester 02/02/2021   Crohn's disease with complication (HCC)    Leukocytosis    Sepsis ([place])    Enteritis    Obesity, Class III, BMI 40-49.9 (morbid obesity) (HCC)    Small bowel perforation (HCC) 02/09/2020   Iron deficiency anemia 12/29/2019   Atopic dermatitis 08/14/2019    Augmentation: AROM, Pitocin, and Cytotec Complications: None  Intrapartum complications/course:  Delivery Type: spontaneous vaginal delivery Anesthesia: epidural Placenta: spontaneous Laceration: 2nd degree vaginal/perineal, left labial Episiotomy: none Newborn Data: Live born female  Birth Weight: 7 lb 12.2 oz (3520 g) APGAR: 9, 9  Newborn Delivery   Birth date/time: 06/01/2021 11:58:00 Delivery type: Vaginal, Spontaneous      26 y.o. G1P0 at [redacted]w[redacted]d presenting for IOL for BMI > 50, AROM with clear fluid.  She progressed to complete and pushed over an intact perineum and delivered the fetal head, followed promptly by the shoulders. She was in control the whole time, and the baby placed on the maternal abdomen. Delayed cord clamping and  the FOB cut the baby's cord, while the baby was skin to skin. The placenta delivered spontaneously and intact, with trailing membranes. Mom and baby tolerated the procedure well.   Postpartum Procedures:   Post partum course:  Patient had an uncomplicated postpartum course.  By time of discharge on PPD#1, her pain was controlled on oral pain medications; she had appropriate lochia and was ambulating, voiding without difficulty and tolerating regular diet.  She was deemed stable for discharge to home.    Discharge Physical Exam:  BP 92/64 (BP Location: Left Arm)    Pulse 72    Temp 97.6 F (36.4 C) (Oral)    Resp 18    Ht 5\' 5"  (1.651 m)    Wt (!) 143.3 kg    LMP 08/31/2020    SpO2 100%    Breastfeeding Unknown    BMI 52.59 kg/m   General: alert and no distress Pulm: normal respiratory effort Lochia: appropriate Abdomen: soft, NT Uterine Fundus: firm, below umbilicus Extremities: No evidence of DVT seen on physical exam. No lower extremity edema. EdinburghFlavia Haynes Postnatal Depression Scale Screening Tool 06/01/2021  I have been able to laugh and see the funny side of things. 0  I have looked forward with enjoyment to things. 0  I have blamed myself unnecessarily when things went wrong. 0  I have been anxious or worried for no good reason. 1  I have felt scared or panicky for no good reason. 0  Things have been getting on top of me. 1  I have been so unhappy that I have had difficulty sleeping. 0  I have felt sad or miserable. 0  I have been so  unhappy that I have been crying. 0  The thought of harming myself has occurred to me. 0  Edinburgh Postnatal Depression Scale Total 2     Labs: CBC Latest Ref Rng & Units 06/02/2021 05/31/2021 02/29/2020  WBC 4.0 - 10.5 K/uL 12.1(H) 9.0 6.4  Hemoglobin 12.0 - 15.0 g/dL 10.6(L) 10.8(L) 11.1(L)  Hematocrit 36.0 - 46.0 % 33.6(L) 33.7(L) 34.4(L)  Platelets 150 - 400 K/uL 210 220 256   O POS Performed at [facility], [address] [place], [place] [address]  Hemoglobin  Date Value Ref Range Status  06/02/2021 10.6 (L) 12.0 - 15.0 g/dL Final   HCT  Date Value Ref Range Status  06/02/2021 33.6 (L) 36.0 - 46.0 % Final    Disposition: stable, discharge to home Baby Feeding: breastmilk Baby Disposition: home with mom  Contraception: TBD  Prenatal Labs:  Blood type/Rh O pos  Antibody screen neg  Rubella Immune  Varicella Immune  RPR NR  HBsAg Neg  HIV NR  GC neg  Chlamydia neg  Genetic screening negative  1 hour GTT 113  3 hour GTT N/a  GBS Pos   Rh Immune globulin given: n/a Rubella vaccine given: Immune Varicella vaccine given: Immune Tdap vaccine given in AP or PP setting: 01/30/2021 Flu vaccine given in AP or PP setting: 01/30/2021  Plan: Megan Haynes was discharged to home in good condition.  Discharge Instructions: Per After Visit Summary. Activity: Advance as tolerated. Pelvic rest for 6 weeks.   Diet: Regular Discharge Medications: Allergies as of 06/02/2021       Reactions   Other Anaphylaxis   Sulfa Antibiotics Anaphylaxis   Pt states this is incorrect        Medication List     STOP taking these medications    hyoscyamine 0.125 MG tablet Commonly known as: LEVSIN   pantoprazole 40 MG tablet Commonly known as: PROTONIX   triamcinolone ointment 0.1 % Commonly known as: KENALOG       TAKE these medications    acetaminophen 325 MG tablet Commonly known as: Tylenol Take 2 tablets (650 mg total) by mouth every 4 (four) hours as needed (for pain scale < 4).   benzocaine-Menthol 20-0.5 % Aero Commonly known as: DERMOPLAST Apply 1 application topically as needed for irritation (perineal discomfort).   coconut oil Oil Apply 1 application topically as needed.   dibucaine 1 % Oint Commonly known as: NUPERCAINAL Place 1 application rectally as needed for hemorrhoids.   docusate sodium 100 MG capsule Commonly known as: COLACE Take 1 capsule (100 mg total)  by mouth 2 (two) times daily.   ferrous sulfate 325 (65 FE) MG tablet Take 1 tablet (325 mg total) by mouth 2 (two) times daily with a meal.   ibuprofen 600 MG tablet Commonly known as: ADVIL Take 1 tablet (600 mg total) by mouth every 6 (six) hours.   multivitamin-prenatal 27-0.8 MG Tabs tablet Take 1 tablet by mouth daily at 12 noon.   simethicone 80 MG chewable tablet Commonly known as: MYLICON Chew 1 tablet (80 mg total) by mouth as needed for flatulence.   witch hazel-glycerin pad Commonly known as: TUCKS Apply 1 application topically as needed for hemorrhoids.       Outpatient follow up:   Follow-up Information     Linda Hedges, CNM Follow up in 6 week(s).   Specialty: Certified Nurse Midwife Why: postpartum Contact information: [place] [address] [phone]  Signed:   Avelino Leeds [facility]  06/02/2021 9:45 AM

## 2021-06-02 LAB — CBC
HCT: 33.6 % — ABNORMAL LOW (ref 36.0–46.0)
Hemoglobin: 10.6 g/dL — ABNORMAL LOW (ref 12.0–15.0)
MCH: 24.5 pg — ABNORMAL LOW (ref 26.0–34.0)
MCHC: 31.5 g/dL (ref 30.0–36.0)
MCV: 77.6 fL — ABNORMAL LOW (ref 80.0–100.0)
Platelets: 210 10*3/uL (ref 150–400)
RBC: 4.33 MIL/uL (ref 3.87–5.11)
RDW: 16.9 % — ABNORMAL HIGH (ref 11.5–15.5)
WBC: 12.1 10*3/uL — ABNORMAL HIGH (ref 4.0–10.5)
nRBC: 0 % (ref 0.0–0.2)

## 2021-06-02 MED ORDER — FERROUS SULFATE 325 (65 FE) MG PO TABS
325.0000 mg | ORAL_TABLET | Freq: Two times a day (BID) | ORAL | 3 refills | Status: AC
Start: 2021-06-02 — End: ?

## 2021-06-02 MED ORDER — DIBUCAINE (PERIANAL) 1 % EX OINT
1.0000 | TOPICAL_OINTMENT | CUTANEOUS | Status: AC | PRN
Start: 2021-06-02 — End: ?

## 2021-06-02 MED ORDER — ACETAMINOPHEN 325 MG PO TABS
650.0000 mg | ORAL_TABLET | ORAL | Status: AC | PRN
Start: 2021-06-02 — End: ?

## 2021-06-02 MED ORDER — BENZOCAINE-MENTHOL 20-0.5 % EX AERO
1.0000 | INHALATION_SPRAY | CUTANEOUS | Status: AC | PRN
Start: 2021-06-02 — End: ?

## 2021-06-02 MED ORDER — COCONUT OIL OIL
1.0000 | TOPICAL_OIL | 0 refills | Status: AC | PRN
Start: 2021-06-02 — End: ?

## 2021-06-02 MED ORDER — DOCUSATE SODIUM 100 MG PO CAPS
100.0000 mg | ORAL_CAPSULE | Freq: Two times a day (BID) | ORAL | 0 refills | Status: AC
Start: 2021-06-02 — End: ?

## 2021-06-02 MED ORDER — IBUPROFEN 600 MG PO TABS
600.0000 mg | ORAL_TABLET | Freq: Four times a day (QID) | ORAL | 0 refills | Status: AC
Start: 2021-06-02 — End: ?

## 2021-06-02 MED ORDER — SIMETHICONE 80 MG PO CHEW
80.0000 mg | CHEWABLE_TABLET | ORAL | 0 refills | Status: AC | PRN
Start: 2021-06-02 — End: ?

## 2021-06-02 MED ORDER — WITCH HAZEL-GLYCERIN EX PADS
1.0000 | MEDICATED_PAD | CUTANEOUS | 12 refills | Status: AC | PRN
Start: 2021-06-02 — End: ?

## 2021-06-02 NOTE — Progress Notes (Signed)
Pt discharged with infant. Discharge instructions, prescriptions, and follow up appointments given to and reviewed with patient. Pt verbalized understanding. Escorted out by staff. 

## 2021-06-02 NOTE — Lactation Note (Signed)
This note was copied from a baby's chart. Lactation Consultation Note  Patient Name: Megan Haynes Date: 06/02/2021 Reason for consult: Follow-up assessment;1st time breastfeeding Age:27 hours Lactation Rounds: LC to the room for a visit. Mother is holding baby in her arms, baby is fussy and Mother is offering a paci. Mother states feeds are going well but baby is clusterfeeding. LC reviewed and encouraged feeding on demand and with cues. If baby is not cueing we encourage hand expression and spoon feed to wake baby. LC encouraged latching now since baby is cueing. Taught football on the right due to size and position of Mother's breast. Baby is opening wide, latched easily and has a rhythmic sucking pattern and many swallows. She did become sleepy, encouraged more stim and baby picked up feeding again. Reviewed diaper counts for days of life and when to call Peds with questions. Reviewed "understanding Postpartum and Newborn care " booklet at bedside. Reviewed outpatient Lactation number and resources. LC Reviewed pacifier, pumping, and bottles are not encouraged until breastfeeding is established and going well in the first 4 weeks. Parents stated understanding with all teaching.   Maternal Data Has patient been taught Hand Expression?: Yes Does the patient have breastfeeding experience prior to this delivery?: No  Feeding Mother's Current Feeding Choice: Breast Milk  LATCH Score Latch: Repeated attempts needed to sustain latch, nipple held in mouth throughout feeding, stimulation needed to elicit sucking reflex.  Audible Swallowing: Spontaneous and intermittent (baby has lots of swallows when stimulated)  Type of Nipple: Everted at rest and after stimulation  Comfort (Breast/Nipple): Soft / non-tender  Hold (Positioning): Assistance needed to correctly position infant at breast and maintain latch.  LATCH Score: 8   Lactation Tools Discussed/Used     Interventions Interventions: Breast feeding basics reviewed;Assisted with latch;Hand express;Adjust position;Support pillows;Position options;Education  Discharge Discharge Education: Engorgement and breast care;Warning signs for feeding baby Pump: Manual;Personal (has an electric pump and manual)  Consult Status Consult Status: PRN    Leander Tout D Sione Baumgarten 06/02/2021, 10:10 AM

## 2021-06-02 NOTE — Anesthesia Postprocedure Evaluation (Signed)
Anesthesia Post Note  Patient: Megan Haynes  Procedure(s) Performed: AN AD HOC LABOR EPIDURAL  Patient location during evaluation: Mother Baby Anesthesia Type: Epidural Level of consciousness: awake and alert Pain management: pain level controlled Vital Signs Assessment: post-procedure vital signs reviewed and stable Respiratory status: spontaneous breathing, nonlabored ventilation and respiratory function stable Cardiovascular status: stable Postop Assessment: no headache, no backache and epidural receding Anesthetic complications: no   No notable events documented.   Last Vitals:  Vitals:   06/02/21 0619 06/02/21 0830  BP: 125/88 92/64  Pulse:  72  Resp:  18  Temp:  36.4 C  SpO2:  100%    Last Pain:  Vitals:   06/02/21 0830  TempSrc: Oral  PainSc:                  [name]

## 2021-11-16 IMAGING — CT CT ENTEROGRAPHY (ABD-PELV W/ CM)
2 of 5 series · 16 of 46 positions shown, 18 images · IV contrast (iopamidol)
Comparison: 02/09/2020

CLINICAL DATA: Crohn's disease.  History of small bowel fistula.

EXAM:
CT ABDOMEN AND PELVIS WITH CONTRAST (ENTEROGRAPHY)
TECHNIQUE: Multidetector CT of the abdomen and pelvis during bolus
administration of intravenous contrast. Negative oral contrast was
given.
CONTRAST:  100mL 3PA55S-GCC IOPAMIDOL (3PA55S-GCC) INJECTION 61%

[Series 4: enterography 2.00 br40 s3 cor · coronal · 0.96mm/px · 3 of 174 slices shown]
[im 58/174  soft-tissue]
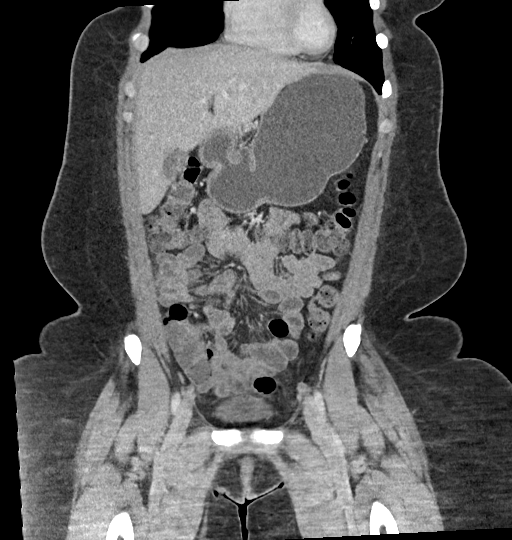
[im 77/174  soft-tissue]
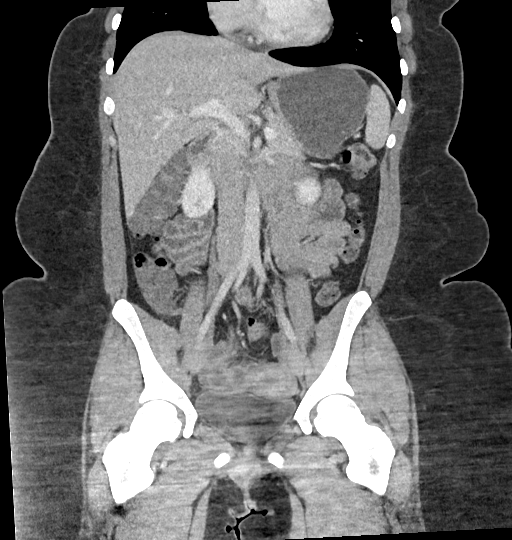
[im 97/174  soft-tissue]
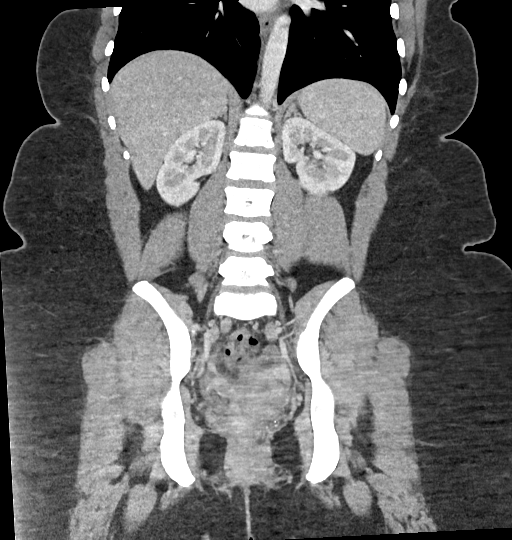

[Series 9: enterography 3.00 br40 s3 axial 3mm · axial · 0.96mm/px · z∈[+1220,+1670]mm · 13 of 172 slices shown, 15 images]
[im 11/172  soft-tissue]
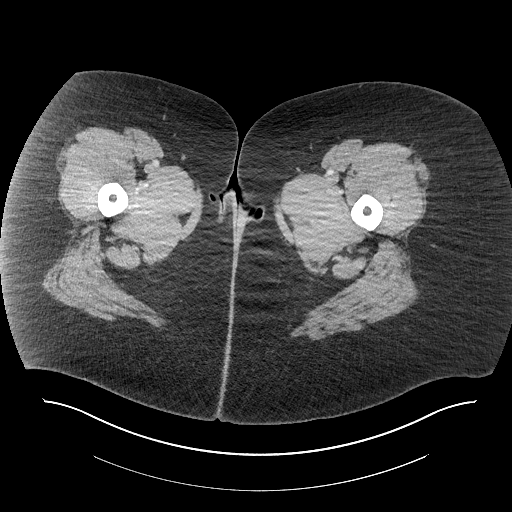
[im 11/172  bone]
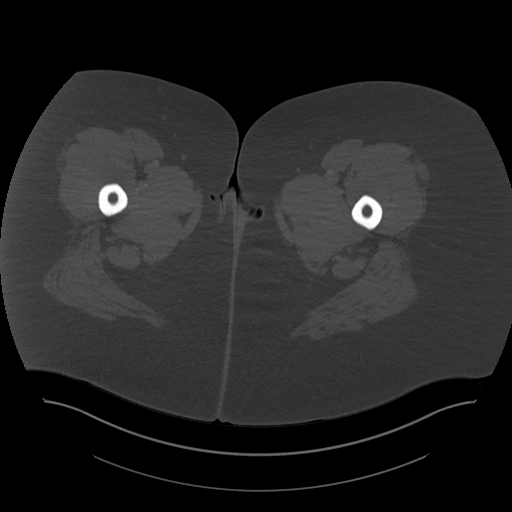
[im 21/172  soft-tissue]
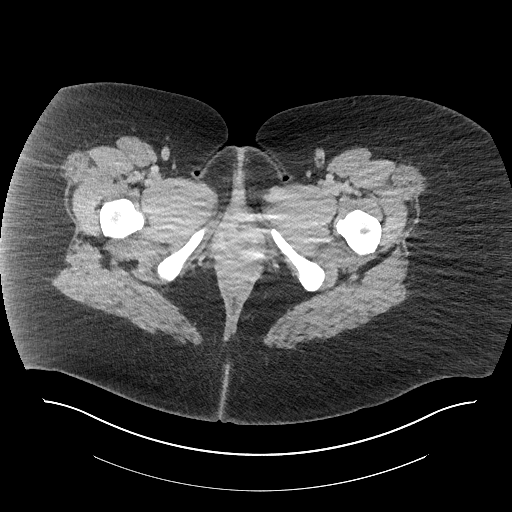
[im 41/172  soft-tissue]
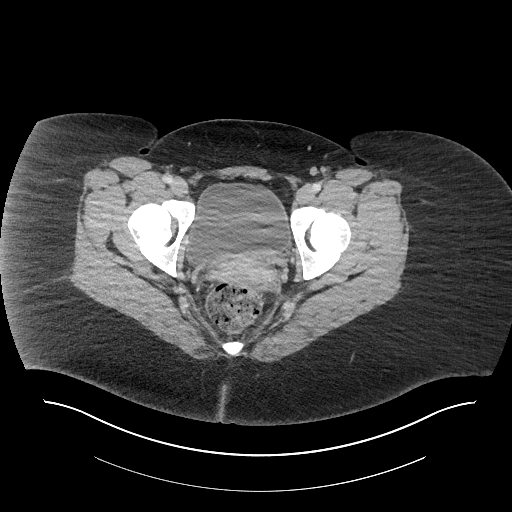
[im 51/172  soft-tissue]
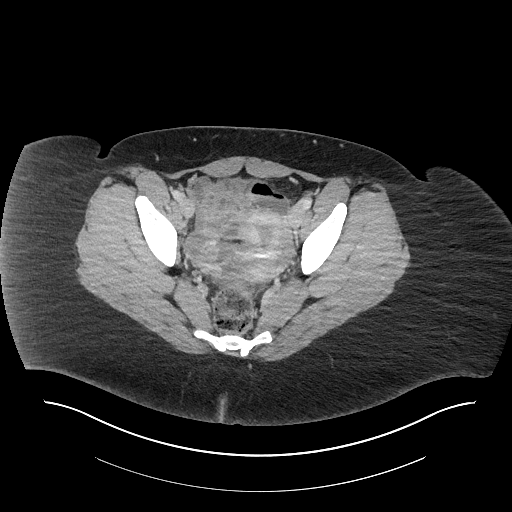
[im 61/172  soft-tissue]
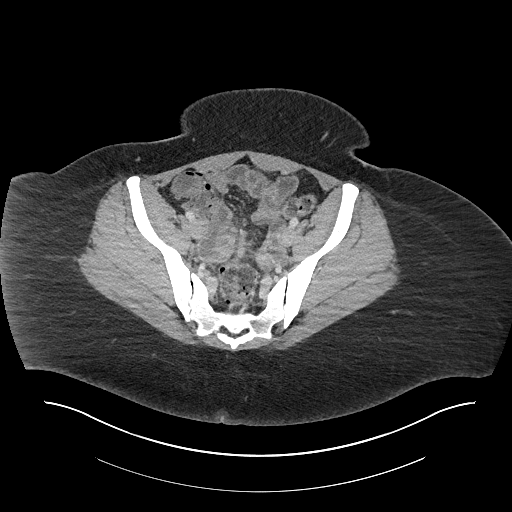
[im 71/172  soft-tissue]
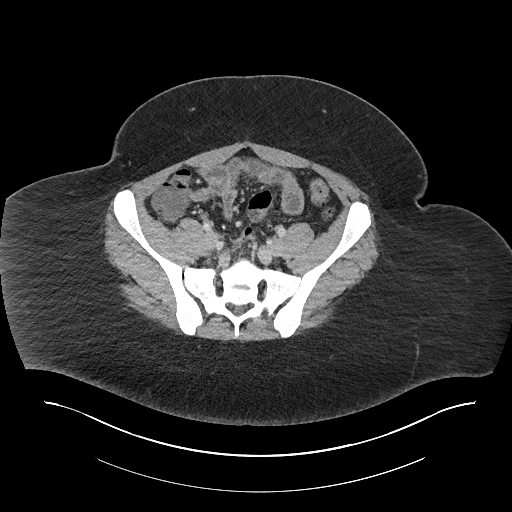
[im 91/172  soft-tissue]
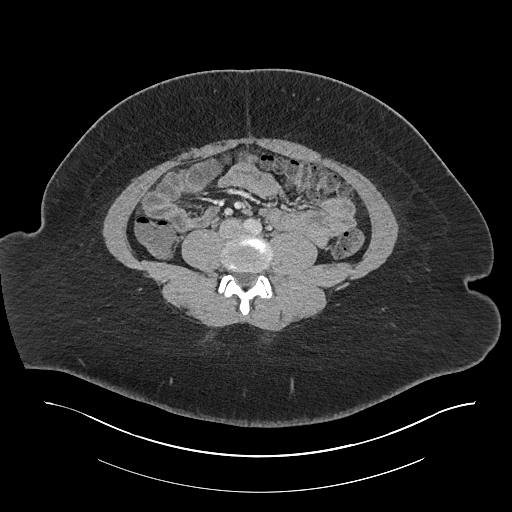
[im 101/172  soft-tissue]
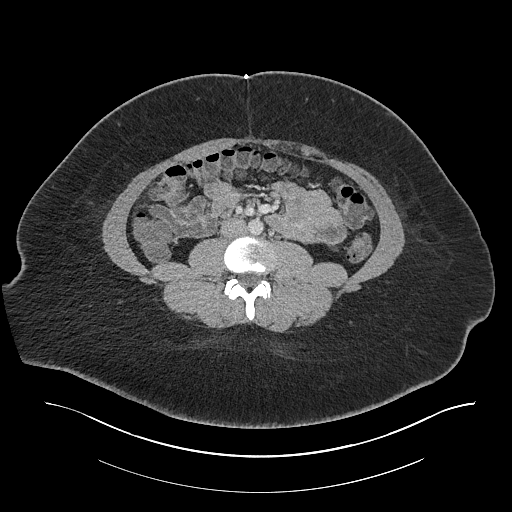
[im 111/172  soft-tissue]
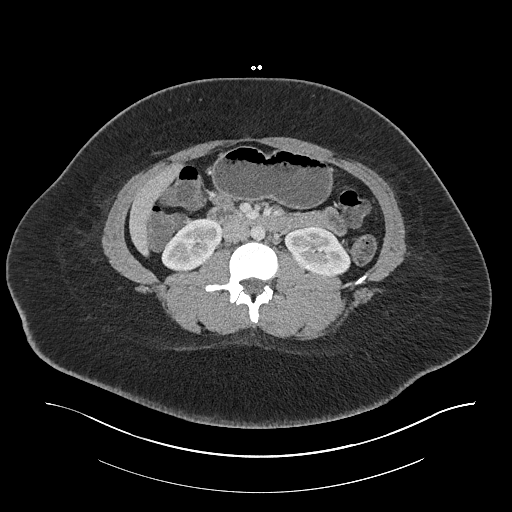
[im 111/172  bone]
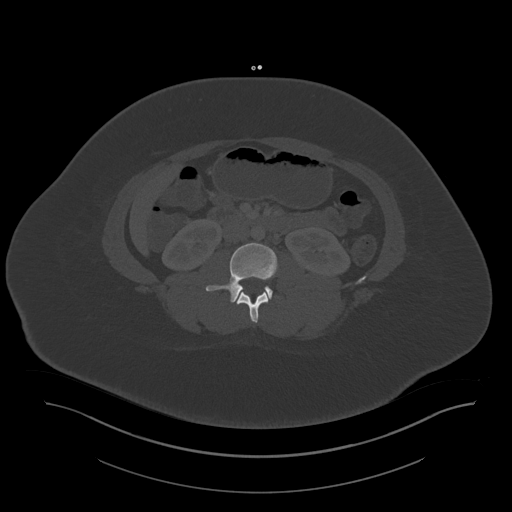
[im 121/172  soft-tissue]
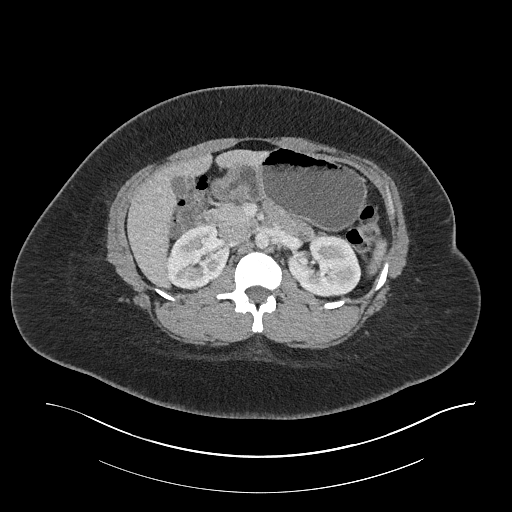
[im 131/172  soft-tissue]
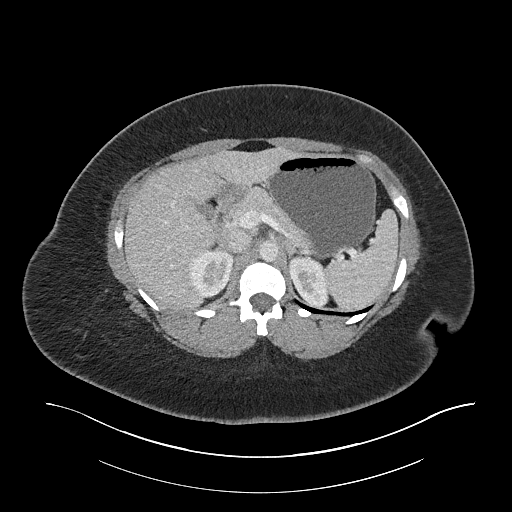
[im 151/172  soft-tissue]
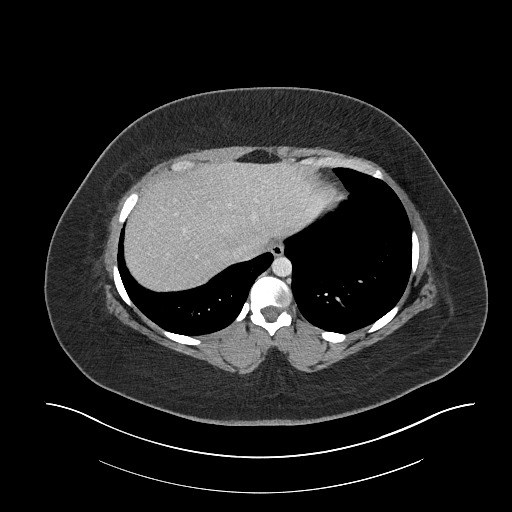
[im 161/172  soft-tissue]
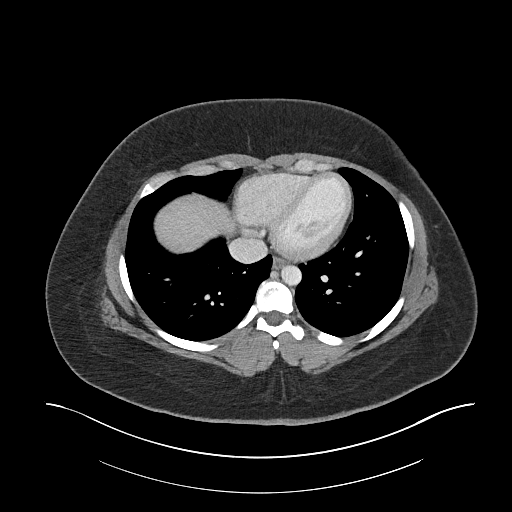

[16 of 46 positions shown; findings below may reference images not displayed]

FINDINGS: Body habitus reduces diagnostic sensitivity and specificity.

Lower chest:  Unremarkable

Hepatobiliary: Stable 1.3 by 1.1 cm hypodense lesion in the dome of
the left hepatic lobe on image 10 of series 2, technically
nonspecific although statistically likely to be benign and
incidental in this 24-year-old patient. Mildly contracted
gallbladder.

Pancreas: Unremarkable

Spleen: Unremarkable

Adrenals/Urinary Tract: Unremarkable

Stomach/Bowel: Normal appendix. Abnormal mucosal enhancement and
wall thickening in the distal 2.6 cm of the terminal ileum on image
105 of series 9. Also in the distal ileum adjacent to the right
adnexa, there is a segment of bowel wall thickening and accentuated
mucosal enhancement shown for example on image 91 of series 4
indicating skip lesion.

On images 79 through 91 of series 4, there is abnormal mesenteric
stranding and density interposed between the sigmoid colon and this
inflamed loop of bowel. This may simply be from inflammation, but
the indistinctness of tissue planes and soft tissue density negative
difficult to totally exclude possibility of an enterocolic fistula
in this region.

On the prior exam, concern was raised about a gas density with
indistinct margins, and the possibility of extraluminal gas or
abscess was raised. At this time, I do not see any extraluminal gas
or abscess

Vascular/Lymphatic: Unremarkable

Reproductive: Unremarkable

Other: No supplemental non-categorized findings.

Musculoskeletal: Unremarkable
IMPRESSION: 1. Abnormal mucosal enhancement and wall thickening in the distal
2.6 cm of the terminal ileum, compatible with active Crohn's
disease. There is also a segment of distal ileum adjacent to the
right adnexa which demonstrates wall thickening, mucosal
enhancement, and also abnormal mesenteric stranding and density
interposed between the sigmoid colon and this inflamed loop of
bowel. This may simply be from inflammation of this presumed skip
lesion, but the indistinctness of tissue planes and soft tissue
density between this loop of small bowel and the adjacent sigmoid
colon raise the possibility of an enterocolic fistula in this
region. At this time, I do not see any extraluminal gas or abscess.
2. Stable hypodense lesion in the dome of the left hepatic lobe,
technically nonspecific although statistically likely to be benign
and incidental in this 24-year-old patient.

## 2023-08-16 NOTE — Progress Notes (Signed)
 Megan Haynes is a 28 y.o. female who is here for an annual physical.  History of Present Illness Megan Haynes is a 28 year old female who presents for an annual physical exam.  She has been focusing on weight loss and managing her prediabetes, with her A1c now within normal limits. She has lost about five pounds over the last five to six months. Her cholesterol levels have improved since last year, and her blood pressure remains low. She exercises regularly, attending the gym three times a week for weight lifting and cardiovascular activities such as the Stairmaster.  She experiences nausea related to her Crohn's disease and occasionally takes Zofran . She finds it helpful in managing her symptoms.  Her iron levels have been on the low end of normal, and she is currently taking Geritol and orange juice daily but struggles with intolerance of stomach upset. She has been anemic, likely due to iron deficiency, and is trying to manage it with these supplements. She would prefer not to have to do infusions.  She consumes fast food approximately once a day, often choosing options like [employer] or [employer]. She has reduced her alcohol intake to rare use, maybe monthly, and consumes caffeine daily, typically one serving a day. No acute symptoms like restless leg syndrome, dizziness, or heart palpitations.    Preventive Care:  - Dentist: not recent, getting established for wisdom teeth removal.   - Ophthalmology: glasses, goes annually  - Pap smear: 07/2022 NILM, HPV neg, repeat in 3-5  - Mammogram: no family hx, defer  - Colonoscopy: no family hx, defer  - Exercise: gym 3x/week, lift weights, stairmaster  - Healthy food choices: rare alcohol, caffeine 1 serving daily, fresca soda, fast food daily, eats from all food groups.  - Tdap: 09/20/20  Patient Active Problem List  Diagnosis  . Atopic dermatitis  . Crohn's disease without complication (CMS/HHS-HCC)  . Morbid obesity with BMI of  50.0-59.9, adult (CMS/HHS-HCC)  . Obesity in pregnancy, antepartum (HHS-HCC)  . Maternal iron deficiency anemia complicating pregnancy in third trimester (HHS-HCC)  . Prediabetes    Past Medical History:  Diagnosis Date  . Anemia 2020  . Asthma, unspecified asthma severity, unspecified whether complicated, unspecified whether persistent (HHS-HCC) 2005  . Crohn's disease (CMS/HHS-HCC) 02/10/2020  . Gestational hypertension (HHS-HCC) 05/31/2021  . IBS (irritable bowel syndrome) 09/2018  . Iron deficiency anemia 12/29/2019  . Small bowel perforation (CMS/HHS-HCC) 02/09/2020    No past surgical history on file.  Family History  Problem Relation Name Age of Onset  . Cancer Father         lymphoma  . Diabetes type II Maternal Grandmother Augustine   . Hyperlipidemia (Elevated cholesterol) Maternal Grandmother Augustine   . Diabetes Maternal Grandmother Augustine   . High blood pressure (Hypertension) Maternal Grandmother Augustine     Social History   Socioeconomic History  . Marital status: Single  . Number of children: 0  . Years of education: 6  . Highest education level: High school graduate  Tobacco Use  . Smoking status: Never    Passive exposure: Past  . Smokeless tobacco: Never  Vaping Use  . Vaping status: Never Used  Substance and Sexual Activity  . Alcohol use: Not Currently  . Drug use: Not Currently  . Sexual activity: Yes    Partners: Male    Birth control/protection: Condom   Social Drivers of Health   Financial Resource Strain: Patient Declined (08/16/2023)   Overall Financial Resource Strain (CARDIA)   .  Difficulty of Paying Living Expenses: Patient declined  Food Insecurity: Patient Declined (08/16/2023)   Hunger Vital Sign   . Worried About Programme researcher, broadcasting/film/video in the Last Year: Patient declined   . Ran Out of Food in the Last Year: Patient declined  Transportation Needs: Patient Declined (08/16/2023)   PRAPARE - Transportation   . Lack of  Transportation (Medical): Patient declined   . Lack of Transportation (Non-Medical): Patient declined  Physical Activity: Inactive (06/25/2019)   Exercise Vital Sign   . Days of Exercise per Week: 0 days   . Minutes of Exercise per Session: 0 min  Stress: No Stress Concern Present (06/25/2019)   [employer] of Occupational Health - Occupational Stress Questionnaire   . Feeling of Stress : Not at all  Social Connections: Socially Isolated (06/25/2019)   Social Connection and Isolation Panel [NHANES]   . Frequency of Communication with Friends and Family: More than three times a week   . Frequency of Social Gatherings with Friends and Family: Twice a week   . Attends Religious Services: Never   . Active Member of Clubs or Organizations: No   . Attends Banker Meetings: Never   . Marital Status: Never married  Housing Stability: Patient Declined (08/16/2023)   Housing Stability Vital Sign   . Unable to Pay for Housing in the Last Year: Patient declined   . Number of Times Moved in the Last Year: 0   . Homeless in the Last Year: Patient declined    Current Outpatient Medications  Medication Sig Dispense Refill  . ferrous sulfate  (IRON ORAL) Take by mouth    . fluticasone propionate (FLONASE) 50 mcg/actuation nasal spray Place 2 sprays into both nostrils once daily 16 g 0  . predniSONE (DELTASONE) 10 MG tablet Take 1 tablet (10 mg total) by mouth once daily 10 tablet 0  . SPRINTEC, 28, 0.25-35 mg-mcg tablet Take 1 tablet by mouth once daily 84 tablet 0  . tretinoin (RETIN-A) 0.05 % cream Apply topically at bedtime 45 g 0   No current facility-administered medications for this visit.    Allergies  Allergen Reactions  . Sulfa (Sulfonamide Antibiotics) Anaphylaxis  . Tree Nuts Anaphylaxis    Results for orders placed or performed in visit on 08/08/23  CBC w/auto Differential (5 Part)  Result Value Ref Range   WBC (White Blood Cell Count) 6.9 4.1 - 10.2 10^3/uL   RBC  (Red Blood Cell Count) 4.57 4.04 - 5.48 10^6/uL   Hemoglobin 10.7 (L) 12.0 - 15.0 gm/dL   Hematocrit 65.6 (L) 64.9 - 47.0 %   MCV (Mean Corpuscular Volume) 75.1 (L) 80.0 - 100.0 fl   MCH (Mean Corpuscular Hemoglobin) 23.4 (L) 27.0 - 31.2 pg   MCHC (Mean Corpuscular Hemoglobin Concentration) 31.2 (L) 32.0 - 36.0 gm/dL   Platelet Count 702 849 - 450 10^3/uL   RDW-CV (Red Cell Distribution Width) 16.8 (H) 11.6 - 14.8 %   MPV (Mean Platelet Volume) 11.8 9.4 - 12.4 fl   Neutrophils 5.17 1.50 - 7.80 10^3/uL   Lymphocytes 0.83 (L) 1.00 - 3.60 10^3/uL   Monocytes 0.54 0.00 - 1.50 10^3/uL   Eosinophils 0.27 0.00 - 0.55 10^3/uL   Basophils 0.03 0.00 - 0.09 10^3/uL   Neutrophil % 75.6 (H) 32.0 - 70.0 %   Lymphocyte % 12.1 10.0 - 50.0 %   Monocyte % 7.9 4.0 - 13.0 %   Eosinophil % 3.9 1.0 - 5.0 %   Basophil% 0.4 0.0 -  2.0 %   Immature Granulocyte % 0.1 <=0.7 %   Immature Granulocyte Count 0.01 <=0.06 10^3/L  Comprehensive Metabolic Panel (CMP)  Result Value Ref Range   Glucose 91 70 - 110 mg/dL   Sodium 859 863 - 854 mmol/L   Potassium 4.5 3.6 - 5.1 mmol/L   Chloride 106 97 - 109 mmol/L   Carbon Dioxide (CO2) 26.0 22.0 - 32.0 mmol/L   Urea Nitrogen (BUN) 8 7 - 25 mg/dL   Creatinine 0.9 0.6 - 1.1 mg/dL   Glomerular Filtration Rate (eGFR) 89 >60 mL/min/1.73sq m   Calcium 9.1 8.7 - 10.3 mg/dL   AST  11 8 - 39 U/L   ALT  12 5 - 38 U/L   Alk Phos (alkaline Phosphatase) 51 34 - 104 U/L   Albumin 3.7 3.5 - 4.8 g/dL   Bilirubin, Total 0.3 0.3 - 1.2 mg/dL   Protein, Total 6.9 6.1 - 7.9 g/dL   A/G Ratio 1.2 1.0 - 5.0 gm/dL  Hemoglobin J8R  Result Value Ref Range   Hemoglobin A1C 5.6 4.2 - 5.6 %   Average Blood Glucose (Calc) 114 mg/dL   Narrative   Normal Range:    4.2 - 5.6% Increased Risk:  5.7 - 6.4% Diabetes:        >= 6.5% Glycemic Control for adults with diabetes:  <7%    Lipid Panel w/calc LDL  Result Value Ref Range   Cholesterol, Total 226 (H) 100 - 200 mg/dL   Triglyceride 894  35 - 199 mg/dL   HDL (High Density Lipoprotein) Cholesterol 71.3 35.0 - 85.0 mg/dL   LDL Calculated 865 (H) 0 - 130 mg/dL   VLDL Cholesterol 21 mg/dL   Cholesterol/HDL Ratio 3.2   Iron Panel  Result Value Ref Range   Iron 23 (L) 28 - 170 ug/dL   Total Iron Binding Capacity (TIBC) 423.1 261.0 - 478.0 ug/dL   Transferrin 697.7 796.9 - 362.0 mg/dL   % Saturation 5 %  Ferritin  Result Value Ref Range   Ferritin 16 11 - 307 ng/mL    BP Readings from Last 3 Encounters:  08/16/23 120/84  07/09/23 124/76  02/08/23 120/86    Wt Readings from Last 3 Encounters:  08/16/23 (!) 126.4 kg (278 lb 9.6 oz)  07/09/23 (!) 128.3 kg (282 lb 12.8 oz)  02/08/23 (!) 128.5 kg (283 lb 3.2 oz)    Body mass index is 46.36 kg/m.   Review of Systems:  Review of Systems  Constitutional:  Negative for diaphoresis, malaise/fatigue and weight loss.  Eyes:  Negative for blurred vision, double vision and photophobia.  Respiratory:  Negative for cough, shortness of breath and wheezing.   Cardiovascular:  Negative for chest pain, palpitations and leg swelling.  Gastrointestinal:  Negative for constipation and diarrhea.  Musculoskeletal:  Negative for joint pain and myalgias.  Skin:  Negative for rash.  Neurological:  Negative for dizziness and headaches.  Psychiatric/Behavioral:  The patient does not have insomnia.       Physical Exam:  Vitals:   08/16/23 1120  BP: 120/84  Pulse: 87  SpO2: 98%  Weight: (!) 126.4 kg (278 lb 9.6 oz)  Height: 165.1 cm (5' 5)    Physical Exam Constitutional:      General: She is not in acute distress.    Appearance: Normal appearance.  HENT:     Head: Normocephalic and atraumatic.     Right Ear: Tympanic membrane, ear canal and external ear normal.  Left Ear: Tympanic membrane, ear canal and external ear normal.     Nose: Nose normal.     Mouth/Throat:     Lips: Pink.     Mouth: Mucous membranes are moist.     Tongue: Tongue does not deviate from  midline.     Pharynx: Oropharynx is clear. Uvula midline.  Eyes:     General: Lids are normal. Vision grossly intact. Gaze aligned appropriately.     Extraocular Movements: Extraocular movements intact.     Pupils: Pupils are equal, round, and reactive to light.  Cardiovascular:     Rate and Rhythm: Normal rate and regular rhythm.     Heart sounds: S1 normal and S2 normal.  Pulmonary:     Effort: Pulmonary effort is normal.     Breath sounds: Normal breath sounds.  Abdominal:     General: Bowel sounds are normal.     Palpations: Abdomen is soft.     Tenderness: There is no abdominal tenderness.  Musculoskeletal:     Cervical back: Full passive range of motion without pain.     Right lower leg: No edema.     Left lower leg: No edema.  Lymphadenopathy:     Cervical: No cervical adenopathy.  Neurological:     General: No focal deficit present.     Mental Status: She is alert and oriented to person, place, and time.  Psychiatric:        Mood and Affect: Mood and affect normal.        Behavior: Behavior is cooperative.       Assessment/Plan:  Assessment & Plan Crohn's disease with nausea Chronic Crohn's disease with recent nausea episodes. Occasional Zofran  use for symptom relief. - Prescribe Zofran  for nausea management.  Iron deficiency anemia Chronic iron deficiency anemia with low iron levels. Discussed alternative supplementation strategies to improve tolerance and absorption. - Continue iron supplementation every other day or every third day for improved tolerance. - Consider referral to hematology for iron infusions if oral supplementation is not tolerated or effective.  Prediabetes Prediabetes with recent normalization of A1c. Continued monitoring of blood glucose levels is recommended. - Maintain current lifestyle modifications.  Wellness Visit Routine wellness visit with no acute concerns. Reviewed lab results, including improved cholesterol levels and normal  A1c, indicating resolution of prediabetes. - Order lab tests for next year. - Continue current exercise routine and dietary habits. - Encourage healthier fast food choices and portion control.       Follow-up 1 year   Attestation Statement:   I personally performed the service, non-incident to. (WP)   TESS LOUISE GREEN, NP    Margaretann Seip, NP

## 2024-01-07 NOTE — Progress Notes (Signed)
 Chief Complaint:    Ms. Bellino is a 28 y.o. female here for threatened abortion.  Patient is 8 weeks by LMP Patient's last menstrual period was 11/16/2023 (exact date). EDC 08/22/24 She complains of 0 days of bright red/dark bleeding. Cramping: no Tissue passage: no Blood type is O pos   OB/GYN History:  OB History  Gravida Para Term Preterm AB Living  1 1 1   1   SAB IAB Ectopic Molar Multiple Live Births       1    # Outcome Date GA Lbr Len/2nd Weight Sex Type Anes PTL Lv  1 Term 06/01/21 [redacted]w[redacted]d 02:00 / 02:58 3.52 kg (7 lb 12.2 oz) F Vag-Spont EPI  LIV    Problem list:  Patient Active Problem List  Diagnosis  . Atopic dermatitis  . Crohn's disease without complication (CMS/HHS-HCC)  . Morbid obesity with BMI of 50.0-59.9, adult (CMS/HHS-HCC)  . Obesity in pregnancy, antepartum (HHS-HCC)  . Maternal iron deficiency anemia complicating pregnancy in third trimester (HHS-HCC)  . Prediabetes    Medical history:  Past Medical History:  Diagnosis Date  . Anemia 2020  . Asthma, unspecified asthma severity, unspecified whether complicated, unspecified whether persistent (HHS-HCC) 2005  . Crohn's disease (CMS/HHS-HCC) 02/10/2020  . Gestational hypertension (HHS-HCC) 05/31/2021  . IBS (irritable bowel syndrome) 09/2018  . Iron deficiency anemia 12/29/2019  . Small bowel perforation (CMS/HHS-HCC) 02/09/2020    Past surgical history: No past surgical history on file.  Medications:  Current Outpatient Medications:  .  ferrous sulfate  (IRON ORAL), Take by mouth (Patient not taking: Reported on 01/14/2024), Disp: , Rfl:  .  fluticasone propionate (FLONASE) 50 mcg/actuation nasal spray, Place 2 sprays into both nostrils once daily (Patient not taking: Reported on 01/14/2024), Disp: 16 g, Rfl: 0 .  SPRINTEC, 28, 0.25-0.035 mg tablet, Take 1 tablet by mouth once daily (Patient not taking: Reported on 01/14/2024), Disp: 84 tablet, Rfl: 0 .  tretinoin (RETIN-A) 0.05 % cream, Apply  topically at bedtime (Patient not taking: Reported on 01/14/2024), Disp: 45 g, Rfl: 0 .  triamcinolone 0.1 % cream, Apply topically 2 (two) times daily (Patient not taking: Reported on 01/14/2024), Disp: 30 g, Rfl: 0  Allergies:  Allergies  Allergen Reactions  . Sulfa (Sulfonamide Antibiotics) Anaphylaxis  . Tree Nuts Anaphylaxis   is allergic to sulfa (sulfonamide antibiotics) and tree nuts.  Social History:  Social History   Socioeconomic History  . Marital status: Single  . Number of children: 0  . Years of education: 73  . Highest education level: High school graduate  Tobacco Use  . Smoking status: Never    Passive exposure: Past  . Smokeless tobacco: Never  Vaping Use  . Vaping status: Never Used  Substance and Sexual Activity  . Alcohol use: Not Currently  . Drug use: Not Currently  . Sexual activity: Yes    Partners: Male    Birth control/protection: Condom   Social Drivers of Health   Financial Resource Strain: Patient Declined (08/16/2023)   Overall Financial Resource Strain (CARDIA)   . Difficulty of Paying Living Expenses: Patient declined  Food Insecurity: Patient Declined (08/16/2023)   Hunger Vital Sign   . Worried About Programme researcher, broadcasting/film/video in the Last Year: Patient declined   . Ran Out of Food in the Last Year: Patient declined  Transportation Needs: Patient Declined (08/16/2023)   PRAPARE - Transportation   . Lack of Transportation (Medical): Patient declined   . Lack of Transportation (Non-Medical): Patient declined  Physical Activity: Inactive (06/25/2019)   Exercise Vital Sign   . Days of Exercise per Week: 0 days   . Minutes of Exercise per Session: 0 min  Stress: No Stress Concern Present (06/25/2019)   [employer] of Occupational Health - Occupational Stress Questionnaire   . Feeling of Stress : Not at all  Social Connections: Socially Isolated (06/25/2019)   Social Connection and Isolation Panel   . Frequency of Communication with Friends and  Family: More than three times a week   . Frequency of Social Gatherings with Friends and Family: Twice a week   . Attends Religious Services: Never   . Active Member of Clubs or Organizations: No   . Attends Banker Meetings: Never   . Marital Status: Never married  Housing Stability: Patient Declined (08/16/2023)   Housing Stability Vital Sign   . Unable to Pay for Housing in the Last Year: Patient declined   . Number of Times Moved in the Last Year: 0   . Homeless in the Last Year: Patient declined    Family History:  Family History  Problem Relation Name Age of Onset  . Cancer Father         lymphoma  . Diabetes type II Maternal Grandmother Augustine   . Hyperlipidemia (Elevated cholesterol) Maternal Grandmother Augustine   . Diabetes Maternal Grandmother Augustine   . High blood pressure (Hypertension) Maternal Grandmother Augustine    family history includes Cancer in her father; Diabetes in her maternal grandmother; Diabetes type II in her maternal grandmother; High blood pressure (Hypertension) in her maternal grandmother; Hyperlipidemia (Elevated cholesterol) in her maternal grandmother.    Review of Systems: General:   No fatigue or weight loss Eyes:   No vision changes Ears:   No hearing difficulty Respiratory:                No cough or shortness of breath Pulmonary:   No asthma or shortness of breath Cardiovascular:        No chest pain, palpitations, dyspnea on exertion Gastrointestinal:          No abdominal bloating, chronic diarrhea, constipations, masses, pain or hematochezia Genitourinary:  No hematuria, dysuria, abnormal vaginal discharge, pelvic pain, Menometrorrhagia Lymphatic:  No swollen lymph nodes Musculoskeletal: No muscle weakness Neurologic:  No extremity weakness, syncope, seizure disorder Psychiatric:  No history of depression, delusions or suicidal/homicidal ideation    Exam:  Constitutional: Vitals:   01/14/24 1409  BP: 132/83   Pulse: 92   Body mass index is 47.09 kg/m.   General Appearance:    Well-developed, well-nourished, no acute distress, appears stated age  Lungs:     Respirations unlabored  Neuro: Psych:   Alert, oriented x3  Appropriate mood and insight, judgement intact   Impression:   The primary encounter diagnosis was Pregnancy with uncertain fetal viability, single or unspecified fetus (HHS-HCC). A diagnosis of Screening for thyroid disorder was also pertinent to this visit.   Plan:   1. Threatened abortion:  Our plan today is to watch for any bleeding which may indicate bleeding - TUVS in 10-14 days to confirmed IUP with cardiac activity - Serial beta HCGs - Return to clinic in 10-14 days   Orders Placed This Encounter  Procedures  . US  FDC OB endovaginal    Standing Status:   Future    Expected Date:   01/21/2024    Expiration Date:   01/13/2025    Release to patient:   Immediate  .  Beta HCG, Quantitative, Blood    Release to patient:   Immediate  . Thyroid Stimulating-Hormone (TSH)    Release to patient:   Immediate    Problem List Items Addressed This Visit   None Visit Diagnoses       Pregnancy with uncertain fetal viability, single or unspecified fetus (HHS-HCC)    -  Primary   Relevant Orders   Beta HCG, Quantitative, Blood   US  FDC OB endovaginal     Screening for thyroid disorder       Relevant Orders   Thyroid Stimulating-Hormone (TSH)       Return for 10-14 days for follow up u/s and viability appt.   Attestation Statement:   I personally performed the service, non-incident to. [facility])   JENNIFER RICHARDSON MYRON DELON MYRON, CNM 01/14/2024 2:57 PM

## 2024-01-28 ENCOUNTER — Emergency Department
Admission: EM | Admit: 2024-01-28 | Discharge: 2024-01-28 | Disposition: A | Discharge status: Home / Self Care | Attending: Emergency Medicine | Admitting: Emergency Medicine

## 2024-01-28 ENCOUNTER — Emergency Department

## 2024-01-28 ENCOUNTER — Other Ambulatory Visit: Payer: Self-pay

## 2024-01-28 DIAGNOSIS — N939 Abnormal uterine and vaginal bleeding, unspecified: Secondary | ICD-10-CM | POA: Diagnosis present

## 2024-01-28 DIAGNOSIS — O039 Complete or unspecified spontaneous abortion without complication: Secondary | ICD-10-CM | POA: Insufficient documentation

## 2024-01-28 DIAGNOSIS — J45909 Unspecified asthma, uncomplicated: Secondary | ICD-10-CM | POA: Insufficient documentation

## 2024-01-28 LAB — COMPREHENSIVE METABOLIC PANEL WITH GFR
ALT: 12 U/L (ref 0–44)
AST: 17 U/L (ref 15–41)
Albumin: 3.5 g/dL (ref 3.5–5.0)
Alkaline Phosphatase: 49 U/L (ref 38–126)
Anion gap: 10 (ref 5–15)
BUN: 12 mg/dL (ref 6–20)
CO2: 24 mmol/L (ref 22–32)
Calcium: 9 mg/dL (ref 8.9–10.3)
Chloride: 103 mmol/L (ref 98–111)
Creatinine, Ser: 0.7 mg/dL (ref 0.44–1.00)
GFR, Estimated: >60 mL/min (ref >60)
Glucose, Bld: 104 mg/dL — ABNORMAL HIGH (ref 70–99)
Potassium: 3.3 mmol/L — ABNORMAL LOW (ref 3.5–5.1)
Sodium: 137 mmol/L (ref 135–145)
Total Bilirubin: 0.4 mg/dL (ref 0.0–1.2)
Total Protein: 7.9 g/dL (ref 6.5–8.1)

## 2024-01-28 LAB — CBC WITH DIFFERENTIAL/PLATELET
Abs Immature Granulocytes: 0.03 K/uL (ref 0.00–0.07)
Basophils Absolute: 0 K/uL (ref 0.0–0.1)
Basophils Relative: 1 %
Eosinophils Absolute: 0.3 K/uL (ref 0.0–0.5)
Eosinophils Relative: 5 %
HCT: 32.3 % — ABNORMAL LOW (ref 36.0–46.0)
Hemoglobin: 10.3 g/dL — ABNORMAL LOW (ref 12.0–15.0)
Immature Granulocytes: 0 %
Lymphocytes Relative: 16 %
Lymphs Abs: 1.2 K/uL (ref 0.7–4.0)
MCH: 23.4 pg — ABNORMAL LOW (ref 26.0–34.0)
MCHC: 31.9 g/dL (ref 30.0–36.0)
MCV: 73.4 fL — ABNORMAL LOW (ref 80.0–100.0)
Monocytes Absolute: 0.7 K/uL (ref 0.1–1.0)
Monocytes Relative: 9 %
Neutro Abs: 5 K/uL (ref 1.7–7.7)
Neutrophils Relative %: 69 %
Platelets: 313 K/uL (ref 150–400)
RBC: 4.4 MIL/uL (ref 3.87–5.11)
RDW: 15.9 % — ABNORMAL HIGH (ref 11.5–15.5)
WBC: 7.2 K/uL (ref 4.0–10.5)
nRBC: 0 % (ref 0.0–0.2)

## 2024-01-28 LAB — HCG, QUANTITATIVE, PREGNANCY: hCG, Beta Chain, Quant, S: 6717 m[IU]/mL — ABNORMAL HIGH (ref <5)

## 2024-01-28 LAB — URINALYSIS, ROUTINE W REFLEX MICROSCOPIC
Bacteria, UA: NONE SEEN
Bilirubin Urine: NEGATIVE
Glucose, UA: NEGATIVE mg/dL
Ketones, ur: NEGATIVE mg/dL
Leukocytes,Ua: NEGATIVE
Nitrite: NEGATIVE
Protein, ur: NEGATIVE mg/dL
RBC / HPF: >50 RBC/hpf (ref 0–5)
Specific Gravity, Urine: 1.018 (ref 1.005–1.030)
pH: 6 (ref 5.0–8.0)

## 2024-01-28 MED ORDER — OXYCODONE HCL 5 MG PO TABS: 5.0000 mg | ORAL_TABLET | Freq: Three times a day (TID) | ORAL | 0 refills | Status: AC | PRN

## 2024-01-28 MED ORDER — MISOPROSTOL 200 MCG PO TABS
800.0000 ug | ORAL_TABLET | Freq: Once | ORAL | Status: AC
  Administered 2024-01-28: 800 ug via ORAL
  Filled 2024-01-28: qty 4

## 2024-01-28 NOTE — ED Notes (Signed)
 Returned from [place]

## 2024-01-28 NOTE — ED Provider Notes (Signed)
 [facility] Provider Note    Event Date/Time   First MD Initiated Contact with Patient 01/28/24 1932     (approximate)   History   Vaginal Bleeding   HPI  Megan Haynes is a 28 y.o. female with history of asthma, Crohn's disease, presenting with vaginal bleeding.  Patient had a positive pregnancy test and was seen by [facility] clinic on 23 September, had an ultrasound done that showed uncertain fetal viability.  Started having vaginal bleeding yesterday.  She noticed small clots this morning.  On independent chart review, she is O+.  When she was seen on his 23 September, she was 8 weeks by last menstrual period, she was told to get a transvaginal ultrasound in 10 to 14 days to confirm IUP.  hCG done at that time was 24,000.  Ultrasound done at the time shows intrauterine gestational sac with a yolk sac but no fetal pole.  Did see subchorionic bleed.     Physical Exam   Triage Vital Signs: ED Triage Vitals  Encounter Vitals Group     BP 01/28/24 1841 (!) 136/92     Girls Systolic BP Percentile --      Girls Diastolic BP Percentile --      Boys Systolic BP Percentile --      Boys Diastolic BP Percentile --      Pulse Rate 01/28/24 1841 (!) 112     Resp 01/28/24 1841 18     Temp 01/28/24 1841 98.2 F (36.8 C)     Temp Source 01/28/24 1841 Oral     SpO2 01/28/24 1841 100 %     Weight 01/28/24 1842 278 lb (126.1 kg)     Height 01/28/24 1842 5' 5 (1.651 m)     Head Circumference --      Peak Flow --      Pain Score 01/28/24 1841 0     Pain Loc --      Pain Education --      Exclude from Growth Chart --     Most recent vital signs: Vitals:   01/28/24 1841 01/28/24 1959  BP: (!) 136/92   Pulse: (!) 112   Resp: 18   Temp: 98.2 F (36.8 C)   SpO2: 100% 100%     General: Awake, no distress.  CV:  Good peripheral perfusion.  Resp:  Normal effort.  Abd:  No distention.  Soft nontender Other:  Nontoxic-appearing   ED Results /  Procedures / Treatments   Labs (all labs ordered are listed, but only abnormal results are displayed) Labs Reviewed  CBC WITH DIFFERENTIAL/PLATELET - Abnormal; Notable for the following components:      Result Value   Hemoglobin 10.3 (*)    HCT 32.3 (*)    MCV 73.4 (*)    MCH 23.4 (*)    RDW 15.9 (*)    All other components within normal limits  URINALYSIS, ROUTINE W REFLEX MICROSCOPIC - Abnormal; Notable for the following components:   Color, Urine YELLOW (*)    APPearance HAZY (*)    Hgb urine dipstick LARGE (*)    All other components within normal limits  HCG, QUANTITATIVE, PREGNANCY - Abnormal; Notable for the following components:   hCG, Beta Chain, Quant, S 6,717 (*)    All other components within normal limits  COMPREHENSIVE METABOLIC PANEL WITH GFR - Abnormal; Notable for the following components:   Potassium 3.3 (*)    Glucose, Bld 104 (*)  All other components within normal limits  URINE CULTURE     RADIOLOGY On my independent interpretation, ultrasound showed gestational sac without IUP   PROCEDURES:  Critical Care performed: No  Procedures   MEDICATIONS ORDERED IN ED: Medications  misoprostol  (CYTOTEC ) tablet 800 mcg (has no administration in time range)  misoprostol  (CYTOTEC ) tablet 800 mcg (has no administration in time range)     IMPRESSION / MDM / ASSESSMENT AND PLAN / ED COURSE  I reviewed the triage vital signs and the nursing notes.                              Differential diagnosis includes, but is not limited to, miscarriage, anemia, electrolyte derangements, subchorionic hematoma.  Labs, HCG, ultrasound.  Patient's presentation is most consistent with acute presentation with potential threat to life or bodily function.  Independent interpretation of labs and imaging below.  Discussed with patient about treatment options during her miscarriage, including expectant management, medical management versus D&C.  Patient thinks that she would  like to proceed with medical management.  Clinical course as below.  Discussed with patient about return precautions and expectant management after taking the Cytotec .  Will give her a short prescription of oxycodone  for severe breakthrough pain.  Considered but no indication for inpatient admission at this time, she safe for outpatient management.  Intro to her to follow-up with OB in 1 week to get repeat ultrasound.  Will discharge with strict return precautions.  Shared decision making done with patient and she is agreeable with this plan.    Clinical Course as of 01/28/24 2246  Tue Jan 28, 2024  1945 Independent review of labs, electrolytes not severely deranged, Hcg is elevated but downtrending compared to prio, H&H stable. [TT]  2014 Urinalysis, Routine w reflex microscopic -Urine, Clean Catch(!) UA not consistent with UTI. [TT]  2211 US  OB Comp Less 14 Wks IMPRESSION: Gestational sac within the lower uterine segment. By history, a prior ultrasound obtained 14 days ago showed a gestational sac with enlarged yolk sac. The yolk sac is no longer present and no fetal pole is seen. Findings meet definitive criteria for failed pregnancy.   [TT]  2214 Discussed with OB about starting medical management for miscarriage, they said to order for her cytotec , if nothing happens to repeat dose in 3 hours.  Recommended giving her several Percocet since the cramping would be painful.  And to expect heavy bleeding, if she soaking more than 2 pads per hour to have her come back to be seen, otherwise follow-up with OB in 1 week to get repeat ultrasound done. [TT]    Clinical Course User Index [TT] Waymond Lorelle Cummins, MD     FINAL CLINICAL IMPRESSION(S) / ED DIAGNOSES   Final diagnoses:  Vaginal bleeding  Miscarriage     Rx / DC Orders   ED Discharge Orders          Ordered    oxyCODONE  (ROXICODONE ) 5 MG immediate release tablet  Every 8 hours PRN        01/28/24 2220              Note:  This document was prepared using Dragon voice recognition software and may include unintentional dictation errors.    Waymond Lorelle Cummins, MD 01/28/24 [id number]

## 2024-01-28 NOTE — ED Triage Notes (Signed)
 Patient states she had a positive home pregnancy test and was seen by [facility] on 01/14/24, US  showed uncertain fetal viability. Has her follow up US  tomorrow but started having vaginal bleeding tonight.

## 2024-01-28 NOTE — Discharge Instructions (Addendum)
 If you do not start having vaginal bleeding or clots coming out, please repeat a dose of the Cytotec  800 mcg 3 hours after you receive the first dose.    Please expect heavy bleeding as well as heavy cramping with the miscarriage, you can take 650 mg of Tylenol  or 400 mg ibuprofen  every 6 hours as needed for pain.  You can also use the oxycodone  prescribed, please do not drive or operate heavy machinery with oxycodone .  If you are soaking through 2 pads per hour, please return to the emergency department to be seen.  Please make sure to follow-up with OB/GYN in the week to get repeat ultrasounds done.

## 2024-01-29 LAB — URINE CULTURE
